# Patient Record
Sex: Male | Born: 1985 | Race: White | Hispanic: No | Marital: Single | Smoking: Current every day smoker
Health system: Southern US, Community
[De-identification: ages and names within clinical notes are randomized; demographics above are authoritative.]

## PROBLEM LIST (undated history)

## (undated) DIAGNOSIS — R454 Irritability and anger: Secondary | ICD-10-CM

## (undated) DIAGNOSIS — F319 Bipolar disorder, unspecified: Secondary | ICD-10-CM

## (undated) DIAGNOSIS — F1110 Opioid abuse, uncomplicated: Secondary | ICD-10-CM

## (undated) DIAGNOSIS — F419 Anxiety disorder, unspecified: Secondary | ICD-10-CM

## (undated) HISTORY — PX: APPENDECTOMY: SHX54

## (undated) HISTORY — PX: HERNIA REPAIR: SHX51

---

## 2006-10-18 ENCOUNTER — Emergency Department (HOSPITAL_COMMUNITY): Admission: EM | Admit: 2006-10-18 | Discharge: 2006-10-18 | Payer: Self-pay | Admitting: *Deleted

## 2009-12-06 ENCOUNTER — Ambulatory Visit (HOSPITAL_COMMUNITY)
Admission: RE | Admit: 2009-12-06 | Discharge: 2009-12-06 | Payer: Self-pay | Source: Home / Self Care | Admitting: Urology

## 2010-11-06 LAB — DIFFERENTIAL
Basophils Absolute: 0.1
Basophils Relative: 1
Eosinophils Absolute: 0
Lymphocytes Relative: 72 — ABNORMAL HIGH
Lymphs Abs: 8.1 — ABNORMAL HIGH
Monocytes Absolute: 1.6 — ABNORMAL HIGH
Neutro Abs: 1.5 — ABNORMAL LOW

## 2010-11-06 LAB — COMPREHENSIVE METABOLIC PANEL
ALT: 188 — ABNORMAL HIGH
AST: 164 — ABNORMAL HIGH
Albumin: 3.6
Alkaline Phosphatase: 147 — ABNORMAL HIGH
Chloride: 98
GFR calc Af Amer: >60
Potassium: 3.8
Sodium: 137
Total Bilirubin: 1.2
Total Protein: 6.9

## 2010-11-06 LAB — MONONUCLEOSIS SCREEN: Mono Screen: POSITIVE — AB

## 2010-11-06 LAB — CBC
HCT: 41.7
Platelets: 107 — ABNORMAL LOW
RDW: 13
WBC: 11.3 — ABNORMAL HIGH

## 2010-11-06 LAB — PATHOLOGIST SMEAR REVIEW

## 2010-11-06 LAB — URINALYSIS, ROUTINE W REFLEX MICROSCOPIC
Glucose, UA: NEGATIVE
Hgb urine dipstick: NEGATIVE
Leukocytes, UA: NEGATIVE
pH: 6.5

## 2011-07-15 ENCOUNTER — Emergency Department (HOSPITAL_COMMUNITY)
Admission: EM | Admit: 2011-07-15 | Discharge: 2011-07-15 | Disposition: A | Discharge status: Home / Self Care | Payer: Self-pay | Attending: Emergency Medicine | Admitting: Emergency Medicine

## 2011-07-15 ENCOUNTER — Encounter (HOSPITAL_COMMUNITY): Payer: Self-pay | Admitting: Emergency Medicine

## 2011-07-15 DIAGNOSIS — K029 Dental caries, unspecified: Secondary | ICD-10-CM | POA: Insufficient documentation

## 2011-07-15 DIAGNOSIS — F17200 Nicotine dependence, unspecified, uncomplicated: Secondary | ICD-10-CM | POA: Insufficient documentation

## 2011-07-15 DIAGNOSIS — Z79899 Other long term (current) drug therapy: Secondary | ICD-10-CM | POA: Insufficient documentation

## 2011-07-15 DIAGNOSIS — F319 Bipolar disorder, unspecified: Secondary | ICD-10-CM | POA: Insufficient documentation

## 2011-07-15 HISTORY — DX: Bipolar disorder, unspecified: F31.9

## 2011-07-15 HISTORY — DX: Irritability and anger: R45.4

## 2011-07-15 HISTORY — DX: Anxiety disorder, unspecified: F41.9

## 2011-07-15 MED ORDER — AMOXICILLIN 250 MG PO CAPS
1000.0000 mg | ORAL_CAPSULE | Freq: Once | ORAL | Status: AC
  Administered 2011-07-15: 1000 mg via ORAL
  Filled 2011-07-15: qty 4

## 2011-07-15 MED ORDER — OXYCODONE-ACETAMINOPHEN 5-325 MG PO TABS
1.0000 | ORAL_TABLET | Freq: Once | ORAL | Status: AC
  Administered 2011-07-15: 1 via ORAL
  Filled 2011-07-15: qty 1

## 2011-07-15 MED ORDER — OXYCODONE-ACETAMINOPHEN 5-325 MG PO TABS: 1.0000 | ORAL_TABLET | ORAL | Status: AC | PRN

## 2011-07-15 MED ORDER — AMOXICILLIN 500 MG PO CAPS: 1000.0000 mg | ORAL_CAPSULE | Freq: Two times a day (BID) | ORAL | Status: AC

## 2011-07-15 NOTE — Discharge Instructions (Signed)
Dental Caries  Tooth decay (dental caries, cavities) is the most common of all oral diseases. It occurs in all ages but is more common in children and young adults.  CAUSES  Bacteria in your mouth combine with foods (particularly sugars and starches) to produce plaque. Plaque is a substance that sticks to the hard surfaces of teeth. The bacteria in the plaque produce acids that attack the enamel of teeth. Repeated acid attacks dissolve the enamel and create holes in the teeth. Root surfaces of teeth may also get these holes.  Other contributing factors include:   Frequent snacking and drinking of cavity-producing foods and liquids.   Poor oral hygiene.   Dry mouth.   Substance abuse such as methamphetamine.   Broken or poor fitting dental restorations.   Eating disorders.   Gastroesophageal reflux disease (GERD).   Certain radiation treatments to the head and neck.  SYMPTOMS  At first, dental decay appears as white, chalky areas on the enamel. In this early stage, symptoms are seldom present. As the decay progresses, pits and holes may appear on the enamel surfaces. Progression of the decay will lead to softening of the hard layers of the tooth. At this point you may experience some pain or achy feeling after sweet, hot, or cold foods or drinks are consumed. If left untreated, the decay will reach the internal structures of the tooth and produce severe pain. Extensive dental treatment, such as root canal therapy, may be needed to save the tooth at this late stage of decay development.  DIAGNOSIS  Most cavities will be detected during regular check-ups. A thorough medical and dental history will be taken by the dentist. The dentist will use instruments to check the surfaces of your teeth for any breakdown or discoloration. Some dentists have special instruments, such as lasers, that detect tooth decay. Dental X-rays may also show some cavities that are not visible to the eye (such as between  the contact areas of the teeth). TREATMENT  Treatment involves removal of the tooth decay and replacement with a restorative material such as silver, gold, or composite (white) material. However, if the decay involves a large area of the tooth and there is little remaining healthy tooth structure, a cap (crown) will be fitted over the remaining structure. If the decay involves the center part of the tooth (pulp), root canal treatment will be needed before any type of dental restoration is placed. If the tooth is severely destroyed by the decay process, leaving the remaining tooth structures unrestorable, the tooth will need to be pulled (extracted). Some early tooth decay may be reversed by fluoride treatments and thorough brushing and flossing at home. PREVENTION   Eat healthy foods. Restrict the amount of sugary, starchy foods and liquids you consume. Avoid frequent snacking and drinking of unhealthy foods and liquids.   Sealants can help with prevention of cavities. Sealants are composite resins applied onto the biting surfaces of teeth at risk for decay. They smooth out the pits and grooves and prevent food from being trapped in them. This is done in early childhood before tooth decay has started.   Fluoride tablets may also be prescribed to children between 6 months and 10 years of age if your drinking water is not fluoridated. The fluoride absorbed by the tooth enamel makes teeth less susceptible to decay. Thorough daily cleaning with a toothbrush and dental floss is the best way to prevent cavities. Use of a fluoride toothpaste is highly recommended. Fluoride mouth rinses   may be used in specific cases.   Topical application of fluoride by your dentist is important in children.   Regular visits with a dentist for checkups and cleanings are also important.  SEEK IMMEDIATE DENTAL CARE IF:  You have a fever.   You develop redness and swelling of your face, jaw, or neck.   You develop swelling  around a tooth.   You are unable to open your mouth or cannot swallow.   You have severe pain uncontrolled by pain medicine.  Document Released: 10/04/2001 Document Revised: 01/01/2011 Document Reviewed: 06/19/2010 [facility] Patient Information 2012 [place], [place].  Amoxicillin capsules or tablets What is this medicine? AMOXICILLIN (a mox i SIL in) is a penicillin antibiotic. It is used to treat certain kinds of bacterial infections. It will not work for colds, flu, or other viral infections. This medicine may be used for other purposes; ask your health care provider or pharmacist if you have questions. What should I tell my health care provider before I take this medicine? They need to know if you have any of these conditions: -asthma -kidney disease -an unusual or allergic reaction to amoxicillin, other penicillins, cephalosporin antibiotics, other medicines, foods, dyes, or preservatives -pregnant or trying to get pregnant -breast-feeding How should I use this medicine? Take this medicine by mouth with a glass of water. Follow the directions on your prescription label. You may take this medicine with food or on an empty stomach. Take your medicine at regular intervals. Do not take your medicine more often than directed. Take all of your medicine as directed even if you think your are better. Do not skip doses or stop your medicine early. Talk to your pediatrician regarding the use of this medicine in children. While this drug may be prescribed for selected conditions, precautions do apply. Overdosage: If you think you have taken too much of this medicine contact a poison control center or emergency room at once. NOTE: This medicine is only for you. Do not share this medicine with others. What if I miss a dose? If you miss a dose, take it as soon as you can. If it is almost time for your next dose, take only that dose. Do not take double or extra doses. What may interact with this  medicine? -amiloride -birth control pills -chloramphenicol -macrolides -probenecid -sulfonamides -tetracyclines This list may not describe all possible interactions. Give your health care provider a list of all the medicines, herbs, non-prescription drugs, or dietary supplements you use. Also tell them if you smoke, drink alcohol, or use illegal drugs. Some items may interact with your medicine. What should I watch for while using this medicine? Tell your doctor or health care professional if your symptoms do not improve in 2 or 3 days. Take all of the doses of your medicine as directed. Do not skip doses or stop your medicine early. If you are diabetic, you may get a false positive result for sugar in your urine with certain brands of urine tests. Check with your doctor. Do not treat diarrhea with over-the-counter products. Contact your doctor if you have diarrhea that lasts more than 2 days or if the diarrhea is severe and watery. What side effects may I notice from receiving this medicine? Side effects that you should report to your doctor or health care professional as soon as possible: -allergic reactions like skin rash, itching or hives, swelling of the face, lips, or tongue -breathing problems -dark urine -redness, blistering, peeling or loosening of the  skin, including inside the mouth -seizures -severe or watery diarrhea -trouble passing urine or change in the amount of urine -unusual bleeding or bruising -unusually weak or tired -yellowing of the eyes or skin Side effects that usually do not require medical attention (report to your doctor or health care professional if they continue or are bothersome): -dizziness -headache -stomach upset -trouble sleeping This list may not describe all possible side effects. Call your doctor for medical advice about side effects. You may report side effects to FDA at 1-800-FDA-1088. Where should I keep my medicine? Keep out of the reach of  children. Store between 68 and 77 degrees F (20 and 25 degrees C). Keep bottle closed tightly. Throw away any unused medicine after the expiration date. NOTE: This sheet is a summary. It may not cover all possible information. If you have questions about this medicine, talk to your doctor, pharmacist, or health care provider.  2012, [employer] Standard. (04/05/2007 2:10:59 PM)  Acetaminophen; Oxycodone tablets What is this medicine? ACETAMINOPHEN; OXYCODONE (a set a MEE noe fen; ox i KOE done) is a pain reliever. It is used to treat mild to moderate pain. This medicine may be used for other purposes; ask your health care provider or pharmacist if you have questions. What should I tell my health care provider before I take this medicine? They need to know if you have any of these conditions: -brain tumor -Crohn's disease, inflammatory bowel disease, or ulcerative colitis -drink more than 3 alcohol containing drinks per day -drug abuse or addiction -head injury -heart or circulation problems -kidney disease or problems going to the bathroom -liver disease -lung disease, asthma, or breathing problems -an unusual or allergic reaction to acetaminophen, oxycodone, other opioid analgesics, other medicines, foods, dyes, or preservatives -pregnant or trying to get pregnant -breast-feeding How should I use this medicine? Take this medicine by mouth with a full glass of water. Follow the directions on the prescription label. Take your medicine at regular intervals. Do not take your medicine more often than directed. Talk to your pediatrician regarding the use of this medicine in children. Special care may be needed. Patients over 69 years old may have a stronger reaction and need a smaller dose. Overdosage: If you think you have taken too much of this medicine contact a poison control center or emergency room at once. NOTE: This medicine is only for you. Do not share this medicine with  others. What if I miss a dose? If you miss a dose, take it as soon as you can. If it is almost time for your next dose, take only that dose. Do not take double or extra doses. What may interact with this medicine? -alcohol or medicines that contain alcohol -antihistamines -barbiturates like amobarbital, butalbital, butabarbital, methohexital, pentobarbital, phenobarbital, thiopental, and secobarbital -benztropine -drugs for bladder problems like solifenacin, trospium, oxybutynin, tolterodine, hyoscyamine, and methscopolamine -drugs for breathing problems like ipratropium and tiotropium -drugs for certain stomach or intestine problems like propantheline, homatropine methylbromide, glycopyrrolate, atropine, belladonna, and dicyclomine -general anesthetics like etomidate, ketamine, nitrous oxide, propofol, desflurane, enflurane, halothane, isoflurane, and sevoflurane -medicines for depression, anxiety, or psychotic disturbances -medicines for pain like codeine, morphine, pentazocine, buprenorphine, butorphanol, nalbuphine, tramadol, and propoxyphene -medicines for sleep -muscle relaxants -naltrexone -phenothiazines like perphenazine, thioridazine, chlorpromazine, mesoridazine, fluphenazine, prochlorperazine, promazine, and trifluoperazine -scopolamine -trihexyphenidyl This list may not describe all possible interactions. Give your health care provider a list of all the medicines, herbs, non-prescription drugs, or dietary supplements you use. Also tell them  if you smoke, drink alcohol, or use illegal drugs. Some items may interact with your medicine. What should I watch for while using this medicine? Tell your doctor or health care professional if your pain does not go away, if it gets worse, or if you have new or a different type of pain. You may develop tolerance to the medicine. Tolerance means that you will need a higher dose of the medication for pain relief. Tolerance is normal and is  expected if you take this medicine for a long time. Do not suddenly stop taking your medicine because you may develop a severe reaction. Your body becomes used to the medicine. This does NOT mean you are addicted. Addiction is a behavior related to getting and using a drug for a nonmedical reason. If you have pain, you have a medical reason to take pain medicine. Your doctor will tell you how much medicine to take. If your doctor wants you to stop the medicine, the dose will be slowly lowered over time to avoid any side effects. You may get drowsy or dizzy. Do not drive, use machinery, or do anything that needs mental alertness until you know how this medicine affects you. Do not stand or sit up quickly, especially if you are an older patient. This reduces the risk of dizzy or fainting spells. Alcohol may interfere with the effect of this medicine. Avoid alcoholic drinks. The medicine will cause constipation. Try to have a bowel movement at least every 2 to 3 days. If you do not have a bowel movement for 3 days, call your doctor or health care professional. Do not take Tylenol (acetaminophen) or medicines that have acetaminophen with this medicine. Too much acetaminophen can be very dangerous. Many nonprescription medicines contain acetaminophen. Always read the labels carefully to avoid taking more acetaminophen. What side effects may I notice from receiving this medicine? Side effects that you should report to your doctor or health care professional as soon as possible: -allergic reactions like skin rash, itching or hives, swelling of the face, lips, or tongue -breathing difficulties, wheezing -confusion -light headedness or fainting spells -severe stomach pain -yellowing of the skin or the whites of the eyes Side effects that usually do not require medical attention (report to your doctor or health care professional if they continue or are  bothersome): -dizziness -drowsiness -nausea -vomiting This list may not describe all possible side effects. Call your doctor for medical advice about side effects. You may report side effects to FDA at 1-800-FDA-1088. Where should I keep my medicine? Keep out of the reach of children. This medicine can be abused. Keep your medicine in a safe place to protect it from theft. Do not share this medicine with anyone. Selling or giving away this medicine is dangerous and against the law. Store at room temperature between 20 and 25 degrees C (68 and 77 degrees F). Keep container tightly closed. Protect from light. Flush any unused medicines down the toilet. Do not use the medicine after the expiration date. NOTE: This sheet is a summary. It may not cover all possible information. If you have questions about this medicine, talk to your doctor, pharmacist, or health care provider.  2012, [employer]vier/Gold Standard. (12/12/2007 10:01:21 AM)

## 2011-07-15 NOTE — ED Notes (Signed)
Patient complaining of right lower dental pain x 2 days. States he is unable to get an appointment with his dentist.

## 2011-07-15 NOTE — ED Provider Notes (Signed)
History     CSN: [id number]  Arrival date & time 07/15/11  0054   First MD Initiated Contact with Patient 07/15/11 0350      Chief Complaint  Patient presents with  . Dental Pain    (Consider location/radiation/quality/duration/timing/severity/associated sxs/prior treatment) HPI 26 year old male comes in with a pain in his left lower molars for the last 2 days. Pain is worse with chewing, worse with exposure to heat and cold. He rates pain at 8/10. He is trying to get money together to see his dentist but was advised to come to the emergency department in the meantime. Nothing makes his pain any better. He states that he thinks it's because his wisdom teeth are coming in crooked and cracking his other teeth. 26 year old male who is resting comfortably and in no acute distress. Vital signs are significant for mild hypertension with blood pressure 154/70. Oxygen saturation is 100% which is normal. Head is normocephalic and atraumatic. PERRLA, EOMI. Examination your cavity shows had dental caries and teeth #18 and 19. These teeth are tender to percussion. I do not see any of his third molars. No other acute dental problem is identified. Neck is nontender and supple without adenopathy. Back is nontender. Lungs are clear without rales, wheezes, rhonchi. Heart has regular rate and rhythm without murmur. Abdomen is soft, flat, nontender without masses or hepatosplenomegaly and peristalsis is present. Extremities have no cyanosis or edema, full range of motion is present. Skin is warm and dry without rash. Neurologic: Mental status is normal, cranial nerves are intact, there are no motor or sensory deficits. Past Medical History  Diagnosis Date  . Bipolar 1 disorder   . Anxiety   . Outbursts of anger     Past Surgical History  Procedure Date  . Hernia repair     History reviewed. No pertinent family history.  History  Substance Use Topics  . Smoking status: Current Everyday Smoker -- 0.5  packs/day  . Smokeless tobacco: Not on file  . Alcohol Use: No      Review of Systems  Allergies  Review of patient's allergies indicates no known allergies.  Home Medications   Current Outpatient Rx  Name Route Sig Dispense Refill  . HYDROXYZINE HCL 10 MG PO TABS Oral Take 10 mg by mouth 3 (three) times daily as needed.    Marland Kitchen RISPERIDONE 1 MG PO TABS Oral Take 1 mg by mouth 2 (two) times daily.    . AMOXICILLIN 500 MG PO CAPS Oral Take 2 capsules (1,000 mg total) by mouth 2 (two) times daily. 40 capsule 0  . OXYCODONE-ACETAMINOPHEN 5-325 MG PO TABS Oral Take 1 tablet by mouth every 4 (four) hours as needed for pain. 20 tablet 0    BP 154/78  Pulse 71  Temp 97.4 F (36.3 C) (Oral)  Resp 14  Ht 6\' 3"  (1.905 m)  Wt 165 lb (74.844 kg)  BMI 20.62 kg/m2  SpO2 100%  Physical Exam  ED Course  Procedures (including critical care time)  Labs Reviewed - No data to display No results found.   1. Dental caries       MDM  Dental caries. He will need definitive treatment by a dentist. In the meantime, he is sent home with prescription for amoxicillin and Percocet and is told that he needs to see his dentist as soon as possible.        Dione Booze, MD 07/15/11 [phone]

## 2011-09-23 ENCOUNTER — Emergency Department (HOSPITAL_COMMUNITY)
Admission: EM | Admit: 2011-09-23 | Discharge: 2011-09-23 | Disposition: A | Discharge status: Home / Self Care | Payer: Self-pay | Attending: Emergency Medicine | Admitting: Emergency Medicine

## 2011-09-23 ENCOUNTER — Emergency Department (HOSPITAL_COMMUNITY): Payer: Self-pay

## 2011-09-23 ENCOUNTER — Encounter (HOSPITAL_COMMUNITY): Payer: Self-pay

## 2011-09-23 DIAGNOSIS — W2209XA Striking against other stationary object, initial encounter: Secondary | ICD-10-CM | POA: Insufficient documentation

## 2011-09-23 DIAGNOSIS — F17200 Nicotine dependence, unspecified, uncomplicated: Secondary | ICD-10-CM | POA: Insufficient documentation

## 2011-09-23 DIAGNOSIS — F319 Bipolar disorder, unspecified: Secondary | ICD-10-CM | POA: Insufficient documentation

## 2011-09-23 DIAGNOSIS — S92911A Unspecified fracture of right toe(s), initial encounter for closed fracture: Secondary | ICD-10-CM

## 2011-09-23 DIAGNOSIS — S92919A Unspecified fracture of unspecified toe(s), initial encounter for closed fracture: Secondary | ICD-10-CM | POA: Insufficient documentation

## 2011-09-23 MED ORDER — IBUPROFEN 600 MG PO TABS: 600.0000 mg | ORAL_TABLET | Freq: Four times a day (QID) | ORAL | Status: AC | PRN

## 2011-09-23 MED ORDER — HYDROCODONE-ACETAMINOPHEN 5-325 MG PO TABS: 1.0000 | ORAL_TABLET | ORAL | Status: AC | PRN

## 2011-09-23 NOTE — ED Notes (Signed)
Painful , contused rt 2nd toe. Injured 8 am.

## 2011-09-23 NOTE — ED Notes (Signed)
Running and slipped up on a welcome mat and bent my toe backwards per pt. Possibly broken per pt.

## 2011-09-24 NOTE — ED Provider Notes (Signed)
Medical screening examination/treatment/procedure(s) were performed by non-physician practitioner and as supervising physician I was immediately available for consultation/collaboration.  Donnetta Hutching, MD 09/24/11 2259

## 2011-09-24 NOTE — ED Provider Notes (Signed)
History     CSN: [id number]  Arrival date & time 09/23/11  2033   First MD Initiated Contact with Patient 09/23/11 2210      Chief Complaint  Patient presents with  . Toe Injury    (Consider location/radiation/quality/duration/timing/severity/associated sxs/prior treatment) HPI Comments: Andrew Huffman slipped on a welcome mat this morning,  Causing his right 2nd toe to hyperextend.  He proceed to work today,  But had persistent throbbing pain in the toe.  He was surprised when he got off work to find his entire was bruised and swollen.  He has taken ibuprofen which has helped somewhat with his pain.  There is no radiation of pain, flexing, bending and palpation makes his pain worse.    The history is provided by the patient.    Past Medical History  Diagnosis Date  . Bipolar 1 disorder   . Anxiety   . Outbursts of anger     Past Surgical History  Procedure Date  . Hernia repair     History reviewed. No pertinent family history.  History  Substance Use Topics  . Smoking status: Current Everyday Smoker -- 0.5 packs/day  . Smokeless tobacco: Not on file  . Alcohol Use: No      Review of Systems  Musculoskeletal: Positive for joint swelling and arthralgias.  Skin: Negative for wound.  Neurological: Negative for weakness and numbness.    Allergies  Review of patient's allergies indicates no known allergies.  Home Medications   Current Outpatient Rx  Name Route Sig Dispense Refill  . IBUPROFEN 200 MG PO TABS Oral Take 200 mg by mouth once.    Marland Kitchen HYDROCODONE-ACETAMINOPHEN 5-325 MG PO TABS Oral Take 1 tablet by mouth every 4 (four) hours as needed for pain. 15 tablet 0  . IBUPROFEN 600 MG PO TABS Oral Take 1 tablet (600 mg total) by mouth every 6 (six) hours as needed for pain. 30 tablet 0    BP 135/88  Pulse 82  Temp 98.4 F (36.9 C) (Oral)  Resp 24  Ht 6\' 3"  (1.905 m)  Wt 150 lb (68.04 kg)  BMI 18.75 kg/m2  SpO2 100%  Physical Exam  Constitutional: He  appears well-developed and well-nourished.  HENT:  Head: Atraumatic.  Neck: Normal range of motion.  Cardiovascular:       Pulses equal bilaterally  Musculoskeletal:       Right foot: He exhibits bony tenderness and swelling. He exhibits normal capillary refill and no deformity.       Feet:  Neurological: He is alert. He has normal strength. He displays normal reflexes. No sensory deficit.       Equal strength  Skin: Skin is warm and dry.  Psychiatric: He has a normal mood and affect.    ED Course  Procedures (including critical care time)  Labs Reviewed - No data to display Dg Toe 2nd Right  09/23/2011  *RADIOLOGY REPORT*  Clinical Data: Injury, pain.  RIGTH SECOND TOE  Comparison: None.  Findings: There is a volar plate avulsion fracture of the base of the middle phalanx of the second toe.  No other acute bony or joint abnormality is identified.  IMPRESSION: Volar plate avulsion fracture base of the middle phalanx of the second toe.   Original Report Authenticated By: Bernadene Bell. D'ALESSIO, M.D.      1. Toe fracture, right    Pts toes were buddy taped,  Post op shoe provided.    MDM  xrays reviewed and  discussed with pt.  Encouraged ice, elevation, ibuprofen.  Hydrocodone prescribed for prn pain.  Referral to Dr. Romeo Apple for recheck in 1 week,  Encouraged recheck sooner for any worsening problems.        Burgess Amor, PA 09/24/11 1420

## 2014-11-17 ENCOUNTER — Encounter (HOSPITAL_COMMUNITY): Payer: Self-pay | Admitting: *Deleted

## 2014-11-17 ENCOUNTER — Emergency Department (HOSPITAL_COMMUNITY)
Admission: EM | Admit: 2014-11-17 | Discharge: 2014-11-17 | Disposition: A | Discharge status: Home / Self Care | Payer: Self-pay | Attending: Emergency Medicine | Admitting: Emergency Medicine

## 2014-11-17 DIAGNOSIS — L02413 Cutaneous abscess of right upper limb: Secondary | ICD-10-CM | POA: Insufficient documentation

## 2014-11-17 DIAGNOSIS — Z79899 Other long term (current) drug therapy: Secondary | ICD-10-CM | POA: Insufficient documentation

## 2014-11-17 DIAGNOSIS — Z720 Tobacco use: Secondary | ICD-10-CM | POA: Insufficient documentation

## 2014-11-17 DIAGNOSIS — Z8659 Personal history of other mental and behavioral disorders: Secondary | ICD-10-CM | POA: Insufficient documentation

## 2014-11-17 DIAGNOSIS — L03113 Cellulitis of right upper limb: Secondary | ICD-10-CM | POA: Insufficient documentation

## 2014-11-17 MED ORDER — KETOROLAC TROMETHAMINE 60 MG/2ML IM SOLN
60.0000 mg | Freq: Once | INTRAMUSCULAR | Status: AC
Start: 2014-11-17 — End: 2014-11-17
  Administered 2014-11-17: 60 mg via INTRAMUSCULAR
  Filled 2014-11-17: qty 2

## 2014-11-17 MED ORDER — LIDOCAINE-EPINEPHRINE (PF) 2 %-1:200000 IJ SOLN
20.0000 mL | Freq: Once | INTRAMUSCULAR | Status: AC
  Administered 2014-11-17: 20 mL
  Filled 2014-11-17: qty 20

## 2014-11-17 MED ORDER — IBUPROFEN 800 MG PO TABS: 800.0000 mg | ORAL_TABLET | Freq: Three times a day (TID) | ORAL | Status: DC

## 2014-11-17 MED ORDER — SULFAMETHOXAZOLE-TRIMETHOPRIM 800-160 MG PO TABS: 1.0000 | ORAL_TABLET | Freq: Two times a day (BID) | ORAL | Status: AC

## 2014-11-17 MED ORDER — VANCOMYCIN HCL IN DEXTROSE 1-5 GM/200ML-% IV SOLN
1000.0000 mg | Freq: Once | INTRAVENOUS | Status: AC
  Administered 2014-11-17: 1000 mg via INTRAVENOUS
  Filled 2014-11-17: qty 200

## 2014-11-17 NOTE — ED Provider Notes (Signed)
CSN: [id number]     Arrival date & time 11/17/14  1453 History   First MD Initiated Contact with Patient 11/17/14 1503     Chief Complaint  Patient presents with  . Abscess     (Consider location/radiation/quality/duration/timing/severity/associated sxs/prior Treatment) HPI   Andrew Huffman is a 29 y.o. male, pt with history of IV drug use and mental illness, presents to the ED with complaint of swelling, redness, and pain to the flexor surface of his right arm for the past 3 days. Pt rates pain 10/10, radiates up his arm, and is burning in nature. Last injection of any drug was around 5am this morning and pt states he injected meth. Pt is under police custody, under arrest, and accompanied by [employer]. Pt denies N/V, fever/chills, dizziness, shortness of breath, chest pain, or any other complaints.   Past Medical History  Diagnosis Date  . Bipolar 1 disorder (HCC)   . Anxiety   . Outbursts of anger    Past Surgical History  Procedure Laterality Date  . Hernia repair    . Appendectomy     No family history on file. Social History  Substance Use Topics  . Smoking status: Current Every Day Smoker -- 0.50 packs/day  . Smokeless tobacco: None  . Alcohol Use: No    Review of Systems  Constitutional: Negative for fever, chills and diaphoresis.  Respiratory: Negative for shortness of breath.   Cardiovascular: Negative for chest pain.  Gastrointestinal: Negative for abdominal pain.  Skin:       Area of redness, pain, and swelling on right arm.  All other systems reviewed and are negative.     Allergies  Review of patient's allergies indicates no known allergies.  Home Medications   Prior to Admission medications   Medication Sig Start Date End Date Taking? Authorizing Provider  Cyanocobalamin (B-12 PO) Take 1 tablet by mouth daily.   Yes Historical Provider, MD  ibuprofen (ADVIL,MOTRIN) 200 MG tablet Take 200 mg by mouth once.    Historical Provider, MD   ibuprofen (ADVIL,MOTRIN) 800 MG tablet Take 1 tablet (800 mg total) by mouth 3 (three) times daily. 11/17/14   Shawn C Joy, PA-C  sulfamethoxazole-trimethoprim (BACTRIM DS,SEPTRA DS) 800-160 MG tablet Take 1 tablet by mouth 2 (two) times daily. 11/17/14 11/24/14  Shawn C Joy, PA-C   BP 102/66 mmHg  Pulse 65  Temp(Src) 97.6 F (36.4 C) (Oral)  Resp 18  Ht  (1.905 m)  Wt 160 lb (72.576 kg)  BMI 20.00 kg/m2  SpO2 98% Physical Exam  Constitutional: He appears well-developed and well-nourished. No distress.  HENT:  Head: Normocephalic and atraumatic.  Eyes: Conjunctivae are normal. Pupils are equal, round, and reactive to light.  Cardiovascular: Normal rate, regular rhythm and normal heart sounds.   Pulmonary/Chest: Effort normal. No respiratory distress.  Abdominal: Soft. Bowel sounds are normal.  Musculoskeletal: He exhibits no edema or tenderness.  Neurological: He is alert.  Skin: Skin is warm and dry. He is not diaphoretic.  Approximately 8 inch long area of redness on flexor surface of elbow and forearm. Swelling noted to anterior elbow with pain upon flexion. No discharge noted. Area of fluctuance and raised swelling to anterior elbow.  Sores in various stages of healing noted to arms, legs, chest and back.  Nursing note and vitals reviewed.   ED Course  .Marland KitchenIncision and Drainage Date/Time: 11/17/2014 5:26 PM Performed by: Anselm Pancoast Authorized by: Anselm Pancoast Consent: Verbal consent  obtained. Risks and benefits: risks, benefits and alternatives were discussed Consent given by: patient Patient understanding: patient states understanding of the procedure being performed Patient consent: the patient's understanding of the procedure matches consent given Procedure consent: procedure consent matches procedure scheduled Site marked: the operative site was marked Patient identity confirmed: verbally with patient and arm band Time out: Immediately prior to procedure a  "time out" was called to verify the correct patient, procedure, equipment, support staff and site/side marked as required. Type: abscess Body area: upper extremity Location details: right arm Anesthesia: local infiltration Local anesthetic: lidocaine 2% with epinephrine Anesthetic total: 4 ml Patient sedated: no Needle gauge: 18 Complexity: simple Drainage: purulent and  bloody Drainage amount: moderate Wound treatment: wound left open Packing material: none Patient tolerance: Patient tolerated the procedure well with no immediate complications   (including critical care time) Labs Review Labs Reviewed  WOUND CULTURE    Imaging Review No results found. I have personally reviewed and evaluated these images and lab results as part of my medical decision-making.   EKG Interpretation None      MDM   Final diagnoses:  Abscess of arm, right    Andrew Huffman presents with a swelling and fluctuation to the flexor surface of his right elbow.    Bedside ultrasound reveals cobblestoning that indicates cellulitis extending down pt forearm with an area of possible abscess in flexor surface of right arm. Saved ultrasound images include: normal tissue, cellulitic tissue, and area of fluctuance and swelling.  Drainage performed with approximately 1mL of purulent and bloody fluid obtained. Pt to receive IV Vancomycin and discharged. Erythema borders drawn and labeled.     Anselm PancoastShawn C Joy, PA-C 11/18/14 2208  Eber HongBrian Miller, MD 11/20/14 [phone]

## 2014-11-17 NOTE — ED Notes (Signed)
Red swollen area right lower arm

## 2014-11-17 NOTE — Discharge Instructions (Signed)
You have been seen today for an abscess, which was drained. You are being discharged with antibiotics and ibuprofen. You should take the antibiotics in their entirety. Followup with PCP as needed. Return to ED should symptoms worsen or redness extends outside of the lines drawn on your arm.   Abscess An abscess is an infected area that contains a collection of pus and debris.It can occur in almost any part of the body. An abscess is also known as a furuncle or boil. CAUSES  An abscess occurs when tissue gets infected. This can occur from blockage of oil or sweat glands, infection of hair follicles, or a minor injury to the skin. As the body tries to fight the infection, pus collects in the area and creates pressure under the skin. This pressure causes pain. People with weakened immune systems have difficulty fighting infections and get certain abscesses more often.  SYMPTOMS Usually an abscess develops on the skin and becomes a painful mass that is red, warm, and tender. If the abscess forms under the skin, you may feel a moveable soft area under the skin. Some abscesses break open (rupture) on their own, but most will continue to get worse without care. The infection can spread deeper into the body and eventually into the bloodstream, causing you to feel ill.  DIAGNOSIS  Your caregiver will take your medical history and perform a physical exam. A sample of fluid may also be taken from the abscess to determine what is causing your infection. TREATMENT  Your caregiver may prescribe antibiotic medicines to fight the infection. However, taking antibiotics alone usually does not cure an abscess. Your caregiver may need to make a small cut (incision) in the abscess to drain the pus. In some cases, gauze is packed into the abscess to reduce pain and to continue draining the area. HOME CARE INSTRUCTIONS   Only take over-the-counter or prescription medicines for pain, discomfort, or fever as directed by your  caregiver.  If you were prescribed antibiotics, take them as directed. Finish them even if you start to feel better.  If gauze is used, follow your caregiver's directions for changing the gauze.  To avoid spreading the infection:  Keep your draining abscess covered with a bandage.  Wash your hands well.  Do not share personal care items, towels, or whirlpools with others.  Avoid skin contact with others.  Keep your skin and clothes clean around the abscess.  Keep all follow-up appointments as directed by your caregiver. SEEK MEDICAL CARE IF:   You have increased pain, swelling, redness, fluid drainage, or bleeding.  You have muscle aches, chills, or a general ill feeling.  You have a fever. MAKE SURE YOU:   Understand these instructions.  Will watch your condition.  Will get help right away if you are not doing well or get worse.   This information is not intended to replace advice given to you by your health care provider. Make sure you discuss any questions you have with your health care provider.   Document Released: 10/22/2004 Document Revised: 07/14/2011 Document Reviewed: 03/27/2011 Elsevier Interactive Patient Education 2016 Else[employer] Inc[employer]  Percutaneous Abscess Drain, Care After Refer to this sheet in the next few weeks. These instructions provide you with information on caring for yourself after your procedure. Your health care provider may also give you more specific instructions. Your treatment has been planned according to current medical practices, but problems sometimes occur. Call your health care provider if you have any problems or  questions after your procedure. WHAT TO EXPECT AFTER THE PROCEDURE After your procedure, it is typical to have the following:   A small amount of discomfort in the area where the drainage tube was placed.  A small amount of bruising around the area where the drainage tube was placed.  Sleepiness and fatigue for the rest of  the day from the medicines used. HOME CARE INSTRUCTIONS  Rest at home for 1-2 days following your procedure or as directed by your health care provider.  If you go home right after the procedure, plan to have someone with you for 24 hours.  Do not take a bathor shower for 24 hours after your procedure.  Take medicines only as directed by your health care provider. Ask your health care provider when you can resume taking any normal medicines.  Change bandages (dressings) as directed.   You may be told to record the amount of drainage from the bag every time you empty it. Follow your health care provider's directions for emptying the bag. Write down the amount of drainage, the date, and the time you emptied it.  Call your health care provider when the drain is putting out less than 10 mL of drainage per day for 2-3 days in a row or as directed by your health care provider.  Follow your health care provider's instructions for cleaning the drainage tube. You may need to clean the tube every day so that it does not clog. SEEK MEDICAL CARE IF:  You have increased bleeding (more than a small spot) from the site where the drainage tube was placed.  You have redness, swelling, or increasing pain around the site where the drainage tube was placed.  You notice a discharge or bad smell coming from the site where the drainage tube was placed.  You have a fever or chills.  You have pain that is not helped by medicine.  SEEK IMMEDIATE MEDICAL CARE IF:  There is leakage around the drainage tube.  The drainage tube pulls out.  You suddenly stop having drainage from the tube.  You suddenly have blood in the drainage fluid.  You become dizzy or faint.  You develop a rash.   You have nausea or vomiting.  You have difficulty breathing, feel short of breath, or feel faint.   You develop chest pain.  You have problems with your speech or vision.  You have trouble balancing or moving  your arms or legs.   This information is not intended to replace advice given to you by your health care provider. Make sure you discuss any questions you have with your health care provider.   Document Released: 05/29/2013 Document Revised: 10/31/2013 Document Reviewed: 05/29/2013 Elsevier Interactive Patient Education [employer]

## 2014-11-18 NOTE — ED Provider Notes (Signed)
The patient is a 29 year old male, he has a history of IV drug use and was arrested by police today on charges related to other reasons, he presents because of redness and swelling to his right arm, he denies fevers, he has pain with movement of the arm, he states that he uses needle injection in the right antecubital fossa recently. On exam he does have erythema with a central area of induration at the antecubital fossa, bedside ultrasound reveals extensive cobblestoning over the erythematous area, this is not circumferential around the arm, there is some decreased range of motion secondary to pain, there is no subcutaneous emphysema. Needle drainage of a very small central abscess was performed with approximately 1 mL of purulent material, please see physician assistant note regarding this procedure. I was present and assisted during the entire procedure. IV antibiotics were given, the patient was started on oral antibiotics and instructed to return for a follow-up exam within 24-48 hours or sooner if not improving.  Filed Vitals:   11/17/14 1842  BP: 102/66  Pulse: 65  Temp:   Resp: 18    Medical screening examination/treatment/procedure(s) were conducted as a shared visit with non-physician practitioner(s) and myself.  I personally evaluated the patient during the encounter.  Clinical Impression:   Final diagnoses:  Abscess of arm, right         Eber HongBrian Rosendo Couser, MD 11/20/14 [phone]

## 2014-11-21 LAB — WOUND CULTURE
GRAM STAIN: NONE SEEN
SPECIAL REQUESTS: NORMAL

## 2014-11-22 ENCOUNTER — Telehealth (HOSPITAL_COMMUNITY): Payer: Self-pay

## 2014-11-22 NOTE — Telephone Encounter (Signed)
Post ED Visit - Positive Culture Follow-up  Culture report reviewed by antimicrobial stewardship pharmacist:  []  Celedonio MiyamotoJeremy Frens, Pharm.D., BCPS []  Georgina PillionElizabeth Martin, [address] BCPS []  [place]h Pham, [place]D., BCPS, AAHIVP []  Estella HuskMichelle Turner, Pharm.D., BCPS, AAHIVP []  [place]y Reyes, [address]Ph[address] [x]  Mendel Corningick G. Pharm DCassie TompkinsvilleStewart, [place]Pharm.D.  Positive wound culture Treated with Bactrim DS, organism sensitive to the same and no further patient follow-up is required at this time.  Ashley JacobsFesterman, Sway Guttierrez C 11/22/2014, 10:52 AM

## 2015-06-11 ENCOUNTER — Observation Stay (HOSPITAL_COMMUNITY)

## 2015-06-11 ENCOUNTER — Inpatient Hospital Stay (HOSPITAL_COMMUNITY)
Admission: EM | Admit: 2015-06-11 | Discharge: 2015-06-14 | DRG: 917 | Attending: Internal Medicine | Admitting: Internal Medicine

## 2015-06-11 ENCOUNTER — Emergency Department (HOSPITAL_COMMUNITY)

## 2015-06-11 ENCOUNTER — Encounter (HOSPITAL_COMMUNITY): Payer: Self-pay | Admitting: Emergency Medicine

## 2015-06-11 DIAGNOSIS — F1410 Cocaine abuse, uncomplicated: Secondary | ICD-10-CM | POA: Diagnosis present

## 2015-06-11 DIAGNOSIS — T50901A Poisoning by unspecified drugs, medicaments and biological substances, accidental (unintentional), initial encounter: Secondary | ICD-10-CM

## 2015-06-11 DIAGNOSIS — X58XXXA Exposure to other specified factors, initial encounter: Secondary | ICD-10-CM | POA: Diagnosis present

## 2015-06-11 DIAGNOSIS — F329 Major depressive disorder, single episode, unspecified: Secondary | ICD-10-CM | POA: Diagnosis present

## 2015-06-11 DIAGNOSIS — T401X1A Poisoning by heroin, accidental (unintentional), initial encounter: Principal | ICD-10-CM | POA: Diagnosis present

## 2015-06-11 DIAGNOSIS — F1210 Cannabis abuse, uncomplicated: Secondary | ICD-10-CM | POA: Diagnosis present

## 2015-06-11 DIAGNOSIS — R739 Hyperglycemia, unspecified: Secondary | ICD-10-CM | POA: Diagnosis present

## 2015-06-11 DIAGNOSIS — R4182 Altered mental status, unspecified: Secondary | ICD-10-CM | POA: Diagnosis not present

## 2015-06-11 DIAGNOSIS — D72829 Elevated white blood cell count, unspecified: Secondary | ICD-10-CM | POA: Diagnosis present

## 2015-06-11 DIAGNOSIS — R000 Tachycardia, unspecified: Secondary | ICD-10-CM | POA: Diagnosis present

## 2015-06-11 DIAGNOSIS — F1510 Other stimulant abuse, uncomplicated: Secondary | ICD-10-CM | POA: Diagnosis present

## 2015-06-11 DIAGNOSIS — F411 Generalized anxiety disorder: Secondary | ICD-10-CM | POA: Diagnosis present

## 2015-06-11 DIAGNOSIS — F319 Bipolar disorder, unspecified: Secondary | ICD-10-CM | POA: Diagnosis present

## 2015-06-11 DIAGNOSIS — R112 Nausea with vomiting, unspecified: Secondary | ICD-10-CM | POA: Diagnosis present

## 2015-06-11 DIAGNOSIS — T185XXA Foreign body in anus and rectum, initial encounter: Secondary | ICD-10-CM | POA: Diagnosis present

## 2015-06-11 DIAGNOSIS — F1110 Opioid abuse, uncomplicated: Secondary | ICD-10-CM | POA: Diagnosis present

## 2015-06-11 DIAGNOSIS — I468 Cardiac arrest due to other underlying condition: Secondary | ICD-10-CM | POA: Diagnosis present

## 2015-06-11 DIAGNOSIS — K648 Other hemorrhoids: Secondary | ICD-10-CM | POA: Diagnosis present

## 2015-06-11 DIAGNOSIS — F1910 Other psychoactive substance abuse, uncomplicated: Secondary | ICD-10-CM | POA: Diagnosis present

## 2015-06-11 DIAGNOSIS — F17200 Nicotine dependence, unspecified, uncomplicated: Secondary | ICD-10-CM | POA: Diagnosis present

## 2015-06-11 DIAGNOSIS — F32A Depression, unspecified: Secondary | ICD-10-CM | POA: Diagnosis present

## 2015-06-11 DIAGNOSIS — I471 Supraventricular tachycardia: Secondary | ICD-10-CM | POA: Diagnosis present

## 2015-06-11 DIAGNOSIS — G4700 Insomnia, unspecified: Secondary | ICD-10-CM | POA: Diagnosis present

## 2015-06-11 HISTORY — DX: Opioid abuse, uncomplicated: F11.10

## 2015-06-11 LAB — COMPREHENSIVE METABOLIC PANEL
ALBUMIN: 4.1 g/dL (ref 3.5–5.0)
ALK PHOS: 72 U/L (ref 38–126)
ALT: 44 U/L (ref 17–63)
AST: 42 U/L — AB (ref 15–41)
Anion gap: 9 (ref 5–15)
BILIRUBIN TOTAL: 0.4 mg/dL (ref 0.3–1.2)
BUN: 13 mg/dL (ref 6–20)
CALCIUM: 9 mg/dL (ref 8.9–10.3)
CO2: 30 mmol/L (ref 22–32)
Chloride: 96 mmol/L — ABNORMAL LOW (ref 101–111)
Creatinine, Ser: 0.93 mg/dL (ref 0.61–1.24)
GFR calc Af Amer: >60 mL/min (ref >60)
GLUCOSE: 187 mg/dL — AB (ref 65–99)
POTASSIUM: 4 mmol/L (ref 3.5–5.1)
Sodium: 135 mmol/L (ref 135–145)
TOTAL PROTEIN: 7.4 g/dL (ref 6.5–8.1)

## 2015-06-11 LAB — CBC WITH DIFFERENTIAL/PLATELET
BASOS PCT: 0 %
Basophils Absolute: 0 10*3/uL (ref 0.0–0.1)
Eosinophils Absolute: 0.1 10*3/uL (ref 0.0–0.7)
Eosinophils Relative: 0 %
HEMATOCRIT: 44.3 % (ref 39.0–52.0)
HEMOGLOBIN: 14.2 g/dL (ref 13.0–17.0)
LYMPHS PCT: 5 %
Lymphs Abs: 0.7 10*3/uL (ref 0.7–4.0)
MCH: 28.4 pg (ref 26.0–34.0)
MCHC: 32.1 g/dL (ref 30.0–36.0)
MCV: 88.6 fL (ref 78.0–100.0)
MONO ABS: 0.7 10*3/uL (ref 0.1–1.0)
MONOS PCT: 5 %
NEUTROS ABS: 12.3 10*3/uL — AB (ref 1.7–7.7)
NEUTROS PCT: 90 %
Platelets: 150 10*3/uL (ref 150–400)
RBC: 5 MIL/uL (ref 4.22–5.81)
RDW: 12.6 % (ref 11.5–15.5)
WBC: 13.7 10*3/uL — ABNORMAL HIGH (ref 4.0–10.5)

## 2015-06-11 LAB — RAPID URINE DRUG SCREEN, HOSP PERFORMED
AMPHETAMINES: POSITIVE — AB
BARBITURATES: NOT DETECTED
Benzodiazepines: NOT DETECTED
Cocaine: POSITIVE — AB
OPIATES: POSITIVE — AB
TETRAHYDROCANNABINOL: POSITIVE — AB

## 2015-06-11 LAB — GLUCOSE, CAPILLARY: GLUCOSE-CAPILLARY: 85 mg/dL (ref 65–99)

## 2015-06-11 MED ORDER — SODIUM CHLORIDE 0.9% FLUSH
3.0000 mL | Freq: Two times a day (BID) | INTRAVENOUS | Status: DC
  Administered 2015-06-11 – 2015-06-14 (×5): 3 mL via INTRAVENOUS

## 2015-06-11 MED ORDER — ONDANSETRON HCL 4 MG/2ML IJ SOLN: 4.0000 mg | Freq: Four times a day (QID) | INTRAMUSCULAR | Status: DC | PRN

## 2015-06-11 MED ORDER — MAGNESIUM CITRATE PO SOLN: 1.0000 | Freq: Once | ORAL | Status: DC | PRN

## 2015-06-11 MED ORDER — ONDANSETRON HCL 4 MG/2ML IJ SOLN
4.0000 mg | Freq: Once | INTRAMUSCULAR | Status: AC
  Administered 2015-06-11: 4 mg via INTRAVENOUS
  Filled 2015-06-11: qty 2

## 2015-06-11 MED ORDER — ONDANSETRON HCL 4 MG PO TABS: 4.0000 mg | ORAL_TABLET | Freq: Four times a day (QID) | ORAL | Status: DC | PRN

## 2015-06-11 MED ORDER — ACETAMINOPHEN 650 MG RE SUPP: 650.0000 mg | Freq: Four times a day (QID) | RECTAL | Status: DC | PRN

## 2015-06-11 MED ORDER — SODIUM CHLORIDE 0.9 % IV SOLN: 250.0000 mL | INTRAVENOUS | Status: DC | PRN

## 2015-06-11 MED ORDER — BISACODYL 5 MG PO TBEC
5.0000 mg | DELAYED_RELEASE_TABLET | Freq: Every day | ORAL | Status: DC | PRN
  Administered 2015-06-12: 5 mg via ORAL
  Filled 2015-06-11 (×2): qty 1

## 2015-06-11 MED ORDER — POLYETHYLENE GLYCOL 3350 17 G PO PACK: 17.0000 g | PACK | Freq: Every day | ORAL | Status: DC | PRN

## 2015-06-11 MED ORDER — INSULIN ASPART 100 UNIT/ML ~~LOC~~ SOLN: 0.0000 [IU] | Freq: Three times a day (TID) | SUBCUTANEOUS | Status: DC

## 2015-06-11 MED ORDER — SODIUM CHLORIDE 0.9% FLUSH: 3.0000 mL | INTRAVENOUS | Status: DC | PRN

## 2015-06-11 MED ORDER — NALOXONE HCL 2 MG/2ML IJ SOSY
2.0000 mg | PREFILLED_SYRINGE | Freq: Once | INTRAMUSCULAR | Status: AC
  Administered 2015-06-11: 2 mg via INTRAVENOUS
  Filled 2015-06-11: qty 2

## 2015-06-11 MED ORDER — ACETAMINOPHEN 325 MG PO TABS
650.0000 mg | ORAL_TABLET | Freq: Four times a day (QID) | ORAL | Status: DC | PRN
  Administered 2015-06-12: 650 mg via ORAL
  Filled 2015-06-11: qty 2

## 2015-06-11 MED ORDER — CITALOPRAM HYDROBROMIDE 20 MG PO TABS
20.0000 mg | ORAL_TABLET | Freq: Every day | ORAL | Status: DC
  Administered 2015-06-11 – 2015-06-14 (×4): 20 mg via ORAL
  Filled 2015-06-11 (×4): qty 1

## 2015-06-11 MED ORDER — TRAZODONE HCL 50 MG PO TABS
150.0000 mg | ORAL_TABLET | Freq: Every day | ORAL | Status: DC
  Administered 2015-06-12: 150 mg via ORAL
  Filled 2015-06-11 (×2): qty 3

## 2015-06-11 MED ORDER — SODIUM CHLORIDE 0.9% FLUSH
3.0000 mL | Freq: Two times a day (BID) | INTRAVENOUS | Status: DC
  Administered 2015-06-11 – 2015-06-14 (×3): 3 mL via INTRAVENOUS

## 2015-06-11 MED ORDER — ACTIDOSE WITH SORBITOL 50 GM/240ML PO LIQD
50.0000 g | Freq: Once | ORAL | Status: AC
Start: 2015-06-11 — End: 2015-06-11
  Administered 2015-06-11: 50 g via ORAL
  Filled 2015-06-11: qty 240

## 2015-06-11 MED ORDER — ENOXAPARIN SODIUM 40 MG/0.4ML ~~LOC~~ SOLN
40.0000 mg | SUBCUTANEOUS | Status: DC
  Administered 2015-06-11 – 2015-06-13 (×3): 40 mg via SUBCUTANEOUS
  Filled 2015-06-11 (×3): qty 0.4

## 2015-06-11 NOTE — ED Notes (Signed)
Per EMS, pt was found unresponsive in his jail cell at 1515. States 5 rounds of CPR performed by deputies. Upon EMS arrival, pt cyanotic with agonal respirations. Pt given 1 mg of Narcan and became AOx4. Pt admits to doing heroin yesterday. Pt also admits to having 1.5g of heroin in bag placed in his rectum and bag possibly exploded.

## 2015-06-11 NOTE — ED Notes (Signed)
EDP at bedside  

## 2015-06-11 NOTE — H&P (Signed)
History and Physical    Andrew Huffman WUJ:[id number] DOB: May 04, 1985 DOA: 06/11/2015  PCP: Rudi Heap, MD   [facility]: [facility]  Chief Complaint: Post cardiopulmonary arrest, suspected heroin overdose   HPI: Andrew Huffman is a 30 y.o. male with medical history significant for bipolar disorder, anxiety, and polysubstance abuse who presents with corrections officers from jail following cardiopulmonary arrest suspected secondary to heroin overdose. Patient was reportedly arrested yesterday and states that just prior to this, he placed a bag of 1.5 g heroin in his anus. He also reports using intravenous heroin yesterday. He was found unresponsive in his jail cell approximately 3:15 PM on the date of his admission and underwent 5 rounds of CPR with corrections officer's until EMS arrived and found the patient to be cyanotic and with agonal respirations. A milligram of Narcan was administered by EMS and the patient's condition improved dramatically. The patient's reticence limits the history, but he denies any recent illness and did not have chest pain or palpitations prior to CPR being performed. He denies removing the bag of heroin from his rectum, denies having a bowel movement since it was placed, and suspects that it is still there.  ED Course: Upon arrival to the ED, patient is found to be afebrile, saturating adequately on 2 L/m, tachycardic in the 110s, and with stable blood pressure. Chest x-ray is negative for acute cardiopulmonary disease and KUB is essentially normal with no foreign body identified. Rectal exam is negative for foreign body. CMP is notable for a serum glucose of 187 and CBC features a leukocytosis to 13,700. EKG features a sinus tachycardia with rate 101 and RSR'in V1. UDS is positive for amphetamines, cocaine, THC, and opiates. Patient required another dose of Narcan soon after his arrival in the emergency department. He became nauseous with  nonbloody and nonbilious vomiting and Zofran was administered. Poison control was contacted and recommended observing the patient for at least 6 hours beyond the last Narcan dose. Charcoal was also advised by poison control in administered in the ED. Tachycardia resolved, patient remained hemodynamically stable, and will be admitted for ongoing evaluation and management of suspected heroin overdose with possible retained bag of heroin in the rectum.  Review of Systems:  All other systems reviewed and apart from HPI, are negative.  Past Medical History  Diagnosis Date  . Bipolar 1 disorder (HCC)   . Anxiety   . Outbursts of anger   . Drug abuse, opioid type     Past Surgical History  Procedure Laterality Date  . Hernia repair    . Appendectomy       reports that he has been smoking.  He does not have any smokeless tobacco history on file. He reports that he uses illicit drugs (IV). He reports that he does not drink alcohol.  No Known Allergies  History reviewed. No pertinent family history.   Prior to Admission medications   Medication Sig Start Date End Date Taking? Authorizing Provider  citalopram (CELEXA) 20 MG tablet Take 20 mg by mouth daily.   Yes Historical Provider, MD  traZODone (DESYREL) 150 MG tablet Take 150 mg by mouth at bedtime.   Yes Historical Provider, MD    Physical Exam: Filed Vitals:   06/11/15 1730 06/11/15 1800 06/11/15 1830 06/11/15 1944  BP: 118/61 111/63 117/57 135/46  Pulse: 91 91 80 76  Temp:    98.5 F (36.9 C)  TempSrc:    Oral  Resp: 14 11  13 16  Height:    6\' 3"  (1.905 m)  Weight:    66.724 kg (147 lb 1.6 oz)  SpO2: 97% 99% 100% 93%      Constitutional: NAD, calm, comfortable. Cachectic Eyes: PERTLA, lids and conjunctivae normal ENMT: Mucous membranes are moist. Posterior pharynx clear of any exudate or lesions.   Neck: normal, supple, no masses, no thyromegaly Respiratory: clear to auscultation bilaterally, no wheezing, no crackles.  Normal respiratory effort.    Cardiovascular: Rate ~120 and regular, no significant murmurs / rubs / gallops. No extremity edema. 2+ pedal pulses.   Abdomen: No distension, no tenderness, no masses palpated. Bowel sounds normal.  Musculoskeletal: no clubbing / cyanosis. No joint deformity upper and lower extremities. Normal muscle tone.  Skin: Punctate erythematous papules and superficial excoriations about the extremities, chest, and face. Warm, dry, well-perfused. Neurologic: CN 2-12 grossly intact. Sensation intact, DTR normal. Strength 5/5 in all 4 limbs.  Psychiatric: Normal judgment and insight. Alert and oriented x 3. Normal mood and affect.     Labs on Admission: I have personally reviewed following labs and imaging studies  CBC:  Recent Labs Lab 06/11/15 1638  WBC 13.7*  NEUTROABS 12.3*  HGB 14.2  HCT 44.3  MCV 88.6  PLT 150   Basic Metabolic Panel:  Recent Labs Lab 06/11/15 1638  NA 135  K 4.0  CL 96*  CO2 30  GLUCOSE 187*  BUN 13  CREATININE 0.93  CALCIUM 9.0   GFR: Estimated Creatinine Clearance: 108.6 mL/min (by C-G formula based on Cr of 0.93). Liver Function Tests:  Recent Labs Lab 06/11/15 1638  AST 42*  ALT 44  ALKPHOS 72  BILITOT 0.4  PROT 7.4  ALBUMIN 4.1   No results for input(s): LIPASE, AMYLASE in the last 168 hours. No results for input(s): AMMONIA in the last 168 hours. Coagulation Profile: No results for input(s): INR, PROTIME in the last 168 hours. Cardiac Enzymes: No results for input(s): CKTOTAL, CKMB, CKMBINDEX, TROPONINI in the last 168 hours. BNP (last 3 results) No results for input(s): PROBNP in the last 8760 hours. HbA1C: No results for input(s): HGBA1C in the last 72 hours. CBG: No results for input(s): GLUCAP in the last 168 hours. Lipid Profile: No results for input(s): CHOL, HDL, LDLCALC, TRIG, CHOLHDL, LDLDIRECT in the last 72 hours. Thyroid Function Tests: No results for input(s): TSH, T4TOTAL, FREET4, T3FREE,  THYROIDAB in the last 72 hours. Anemia Panel: No results for input(s): VITAMINB12, FOLATE, FERRITIN, TIBC, IRON, RETICCTPCT in the last 72 hours. Urine analysis:    Component Value Date/Time   COLORURINE AMBER BIOCHEMICALS MAY BE AFFECTED BY COLOR* 10/18/2006 1359   APPEARANCEUR CLEAR 10/18/2006 1359   LABSPEC 1.017 10/18/2006 1359   PHURINE 6.5 10/18/2006 1359   GLUCOSEU NEGATIVE 10/18/2006 1359   HGBUR NEGATIVE 10/18/2006 1359   BILIRUBINUR SMALL* 10/18/2006 1359   KETONESUR TRACE* 10/18/2006 1359   PROTEINUR 30* 10/18/2006 1359   UROBILINOGEN 4.0* 10/18/2006 1359   NITRITE NEGATIVE 10/18/2006 1359   LEUKOCYTESUR NEGATIVE 10/18/2006 1359   Sepsis Labs: @LABRCNTIP (procalcitonin:4,lacticidven:4) )No results found for this or any previous visit (from the past 240 hour(s)).   Radiological Exams on Admission: Dg Chest Portable 1 View  06/11/2015  CLINICAL DATA:  Shortness of breath. EXAM: PORTABLE CHEST 1 VIEW COMPARISON:  March 09, 2015. FINDINGS: The heart size and mediastinal contours are within normal limits. Both lungs are clear. No pneumothorax or pleural effusion is noted. The visualized skeletal structures are unremarkable. IMPRESSION: No  acute cardiopulmonary abnormality seen. Electronically Signed   By: Lupita RaiderJames  Green Jr, M.D.   On: 06/11/2015 16:54   Dg Abd Portable 1v  06/11/2015  CLINICAL DATA:  Abdominal pain. EXAM: PORTABLE ABDOMEN - 1 VIEW COMPARISON:  None. FINDINGS: The bowel gas pattern is normal. No radio-opaque calculi or other significant radiographic abnormality are seen. IMPRESSION: No evidence of bowel obstruction or ileus. Electronically Signed   By: Lupita RaiderJames  Green Jr, M.D.   On: 06/11/2015 17:01    EKG: Independently reviewed. Sinus tachycardia (rate 101), RSR' in V1   Assessment/Plan  1. Opiate overdose with cardiopulmonary arrest  - Resuscitated on the scene; good response to Narcan in the field, and again in ED  - Presumed secondary to heroin overdose    - Poison control advises monitoring for at least 6 hours after last Narcan dose  - Watching on telemetry    2. ?Rectal foreign body  - Pt reports placing 1.5 gram bag of heroin in rectum yesterday; reports that it is still there  - No foreign body seen on KUB; not felt on DRE  - Discussed with radiology; will check a CT pelvis    3. Drug abuse  - UDS positive for THC, amphetamines, cocaine, opiates - SW consulted for possible resources    4. Leukocytosis  - WBC is 13,700 on admission, primarily neutrophils  - No source of infection evident at this time  - Suspected this is reflective of stress demargination in post-code setting    5. Depression, insomnia   - Bipolar 1 disorder documented, not on any antipsychotics  - Pt denies SI, HI, or hallucinations; denies intentional overdose  - Continue current-dose Celexa, trazodone    DVT prophylaxis: sq Lovenox  Code Status: Full  Family Communication: Discussed with patient  Disposition Plan: Observe on telemetry   Consults called: None  Admission status: Observation    Briscoe Deutscherimothy S Opyd, MD [facility] Pager [phone]  If 7PM-7AM, please contact night-coverage [email or web address] Password [email or web address] [email or web address]  06/11/2015, 8:17 PM

## 2015-06-11 NOTE — ED Notes (Signed)
Attempted report x1. 

## 2015-06-11 NOTE — ED Notes (Signed)
Phlebotomy at bedside.

## 2015-06-11 NOTE — ED Provider Notes (Signed)
CSN: [id number]     Arrival date & time 06/11/15  1559 History   First MD Initiated Contact with Patient 06/11/15 1619     Chief Complaint  Patient presents with  . Drug Overdose     (Consider location/radiation/quality/duration/timing/severity/associated sxs/prior Treatment) Patient is a 30 y.o. male presenting with Overdose. The history is provided by the patient and the police (Patient was arrested yesterday around 1 PM just prior to his arrest he put 1.5 g of heroin in a plastic bag up his bottom. He was found today unresponsive and blue. Patient was resuscitated with Narcan. When he arrived here he was lethargic).  Drug Overdose This is a new problem. The current episode started 1 to 2 hours ago. The problem occurs constantly. The problem has been rapidly improving. Pertinent negatives include no chest pain, no abdominal pain and no headaches. Nothing aggravates the symptoms. Nothing relieves the symptoms.    Past Medical History  Diagnosis Date  . Bipolar 1 disorder (HCC)   . Anxiety   . Outbursts of anger   . Drug abuse, opioid type    Past Surgical History  Procedure Laterality Date  . Hernia repair    . Appendectomy     No family history on file. Social History  Substance Use Topics  . Smoking status: Current Every Day Smoker -- 0.50 packs/day  . Smokeless tobacco: None  . Alcohol Use: No    Review of Systems  Constitutional: Negative for appetite change and fatigue.  HENT: Negative for congestion, ear discharge and sinus pressure.   Eyes: Negative for discharge.  Respiratory: Negative for cough.   Cardiovascular: Negative for chest pain.  Gastrointestinal: Negative for abdominal pain and diarrhea.  Genitourinary: Negative for frequency and hematuria.  Musculoskeletal: Negative for back pain.  Skin: Negative for rash.  Neurological: Negative for seizures and headaches.  Psychiatric/Behavioral: Negative for hallucinations.      Allergies  Review of  patient's allergies indicates no known allergies.  Home Medications   Prior to Admission medications   Medication Sig Start Date End Date Taking? Authorizing Provider  Cyanocobalamin (B-12 PO) Take 1 tablet by mouth daily.    Historical Provider, MD  ibuprofen (ADVIL,MOTRIN) 200 MG tablet Take 200 mg by mouth once.    Historical Provider, MD  ibuprofen (ADVIL,MOTRIN) 800 MG tablet Take 1 tablet (800 mg total) by mouth 3 (three) times daily. 11/17/14   Shawn C Joy, PA-C   BP 118/61 mmHg  Pulse 91  Temp(Src) 98 F (36.7 C) (Oral)  Resp 14  Ht 6' (1.829 m)  Wt 155 lb (70.308 kg)  BMI 21.02 kg/m2  SpO2 97% Physical Exam  Constitutional: He is oriented to person, place, and time. He appears well-developed.  HENT:  Head: Normocephalic.  Eyes: Conjunctivae and EOM are normal. No scleral icterus.  Neck: Neck supple. No thyromegaly present.  Cardiovascular: Normal rate and regular rhythm.  Exam reveals no gallop and no friction rub.   No murmur heard. Pulmonary/Chest: No stridor. He has no wheezes. He has no rales. He exhibits no tenderness.  Abdominal: He exhibits no distension. There is no tenderness. There is no rebound.  Genitourinary:  Rectal exam normal no foreign body  Musculoskeletal: Normal range of motion. He exhibits no edema.  Lymphadenopathy:    He has no cervical adenopathy.  Neurological: He is oriented to person, place, and time. He exhibits normal muscle tone. Coordination normal.  Patient moderately lethargic  Skin: No rash noted. No erythema.  Psychiatric:  He has a normal mood and affect. His behavior is normal.    ED Course  Procedures (including critical care time) Labs Review Labs Reviewed  CBC WITH DIFFERENTIAL/PLATELET - Abnormal; Notable for the following:    WBC 13.7 (*)    Neutro Abs 12.3 (*)    All other components within normal limits  COMPREHENSIVE METABOLIC PANEL - Abnormal; Notable for the following:    Chloride 96 (*)    Glucose, Bld 187 (*)     AST 42 (*)    All other components within normal limits  URINE RAPID DRUG SCREEN, HOSP PERFORMED - Abnormal; Notable for the following:    Opiates POSITIVE (*)    Cocaine POSITIVE (*)    Amphetamines POSITIVE (*)    Tetrahydrocannabinol POSITIVE (*)    All other components within normal limits    Imaging Review Dg Chest Portable 1 View  06/11/2015  CLINICAL DATA:  Shortness of breath. EXAM: PORTABLE CHEST 1 VIEW COMPARISON:  March 09, 2015. FINDINGS: The heart size and mediastinal contours are within normal limits. Both lungs are clear. No pneumothorax or pleural effusion is noted. The visualized skeletal structures are unremarkable. IMPRESSION: No acute cardiopulmonary abnormality seen. Electronically Signed   By: Lupita Raider, M.D.   On: 06/11/2015 16:54   Dg Abd Portable 1v  06/11/2015  CLINICAL DATA:  Abdominal pain. EXAM: PORTABLE ABDOMEN - 1 VIEW COMPARISON:  None. FINDINGS: The bowel gas pattern is normal. No radio-opaque calculi or other significant radiographic abnormality are seen. IMPRESSION: No evidence of bowel obstruction or ileus. Electronically Signed   By: Lupita Raider, M.D.   On: 06/11/2015 17:01   I have personally reviewed and evaluated these images and lab results as part of my medical decision-making.   EKG Interpretation None     CRITICAL CARE Performed by: Sadler Teschner L Total critical care time: 40 minutes Critical care time was exclusive of separately billable procedures and treating other patients. Critical care was necessary to treat or prevent imminent or life-threatening deterioration. Critical care was time spent personally by me on the following activities: development of treatment plan with patient and/or surrogate as well as nursing, discussions with consultants, evaluation of patient's response to treatment, examination of patient, obtaining history from patient or surrogate, ordering and performing treatments and interventions, ordering and  review of laboratory studies, ordering and review of radiographic studies, pulse oximetry and re-evaluation of patient's condition.  MDM   Final diagnoses:  Overdose, accidental or unintentional, initial encounter   Fourth control consult to and stated patient should get charcoal. Should be admitted to observation and released once he goes 6 hours without narcan.    Patient given 2 mg of Narcan here 1 week he became lethargic.     Bethann Berkshire, MD 06/11/15 [phone]

## 2015-06-12 DIAGNOSIS — G4700 Insomnia, unspecified: Secondary | ICD-10-CM | POA: Diagnosis present

## 2015-06-12 DIAGNOSIS — X58XXXA Exposure to other specified factors, initial encounter: Secondary | ICD-10-CM | POA: Diagnosis present

## 2015-06-12 DIAGNOSIS — F411 Generalized anxiety disorder: Secondary | ICD-10-CM | POA: Diagnosis present

## 2015-06-12 DIAGNOSIS — D72829 Elevated white blood cell count, unspecified: Secondary | ICD-10-CM

## 2015-06-12 DIAGNOSIS — T401X1A Poisoning by heroin, accidental (unintentional), initial encounter: Secondary | ICD-10-CM | POA: Diagnosis present

## 2015-06-12 DIAGNOSIS — T185XXD Foreign body in anus and rectum, subsequent encounter: Secondary | ICD-10-CM

## 2015-06-12 DIAGNOSIS — T185XXA Foreign body in anus and rectum, initial encounter: Secondary | ICD-10-CM | POA: Diagnosis not present

## 2015-06-12 DIAGNOSIS — F1510 Other stimulant abuse, uncomplicated: Secondary | ICD-10-CM | POA: Diagnosis present

## 2015-06-12 DIAGNOSIS — I471 Supraventricular tachycardia: Secondary | ICD-10-CM | POA: Diagnosis present

## 2015-06-12 DIAGNOSIS — F319 Bipolar disorder, unspecified: Secondary | ICD-10-CM | POA: Diagnosis present

## 2015-06-12 DIAGNOSIS — K648 Other hemorrhoids: Secondary | ICD-10-CM | POA: Diagnosis not present

## 2015-06-12 DIAGNOSIS — F329 Major depressive disorder, single episode, unspecified: Secondary | ICD-10-CM | POA: Diagnosis not present

## 2015-06-12 DIAGNOSIS — F1410 Cocaine abuse, uncomplicated: Secondary | ICD-10-CM | POA: Diagnosis present

## 2015-06-12 DIAGNOSIS — R112 Nausea with vomiting, unspecified: Secondary | ICD-10-CM | POA: Diagnosis not present

## 2015-06-12 DIAGNOSIS — R739 Hyperglycemia, unspecified: Secondary | ICD-10-CM | POA: Diagnosis present

## 2015-06-12 DIAGNOSIS — F1210 Cannabis abuse, uncomplicated: Secondary | ICD-10-CM | POA: Diagnosis present

## 2015-06-12 DIAGNOSIS — F17200 Nicotine dependence, unspecified, uncomplicated: Secondary | ICD-10-CM | POA: Diagnosis present

## 2015-06-12 DIAGNOSIS — I468 Cardiac arrest due to other underlying condition: Secondary | ICD-10-CM | POA: Diagnosis present

## 2015-06-12 DIAGNOSIS — F1110 Opioid abuse, uncomplicated: Secondary | ICD-10-CM | POA: Diagnosis present

## 2015-06-12 DIAGNOSIS — F1910 Other psychoactive substance abuse, uncomplicated: Secondary | ICD-10-CM | POA: Diagnosis not present

## 2015-06-12 DIAGNOSIS — R4182 Altered mental status, unspecified: Secondary | ICD-10-CM | POA: Diagnosis present

## 2015-06-12 LAB — GLUCOSE, CAPILLARY
GLUCOSE-CAPILLARY: 107 mg/dL — AB (ref 65–99)
GLUCOSE-CAPILLARY: 106 mg/dL — AB (ref 65–99)
GLUCOSE-CAPILLARY: 97 mg/dL (ref 65–99)

## 2015-06-12 LAB — MRSA PCR SCREENING: MRSA BY PCR: POSITIVE — AB

## 2015-06-12 MED ORDER — CETYLPYRIDINIUM CHLORIDE 0.05 % MT LIQD
7.0000 mL | Freq: Two times a day (BID) | OROMUCOSAL | Status: DC
  Administered 2015-06-12 – 2015-06-14 (×5): 7 mL via OROMUCOSAL

## 2015-06-12 MED ORDER — PEG 3350-KCL-NA BICARB-NACL 420 G PO SOLR
4000.0000 mL | Freq: Once | ORAL | Status: AC
  Administered 2015-06-12: 4000 mL via ORAL
  Filled 2015-06-12: qty 4000

## 2015-06-12 MED ORDER — MUPIROCIN 2 % EX OINT
1.0000 "application " | TOPICAL_OINTMENT | Freq: Two times a day (BID) | CUTANEOUS | Status: DC
  Administered 2015-06-12 – 2015-06-14 (×5): 1 via NASAL
  Filled 2015-06-12: qty 22

## 2015-06-12 MED ORDER — CHLORHEXIDINE GLUCONATE CLOTH 2 % EX PADS
6.0000 | MEDICATED_PAD | Freq: Every day | CUTANEOUS | Status: DC
  Administered 2015-06-12 – 2015-06-14 (×3): 6 via TOPICAL

## 2015-06-12 NOTE — Consult Note (Signed)
Reason for Consult:FB in rectum. Referring Physician: Admitted thru the ED yesterday. Patient cannot tell events that led up to admission. He thinks he overdosed on heroin. He says he has a bag of heroin in his rectum. He was hiding the heroin so he would not get caught with it.  He says the police were at his house.He was in custody at the jail when he apparently had a cardiac arrest.  He thinks the heroin leaked out of the bag. He has two small zip lock bags in his rectum.  He has not had a BM since admission .Andrew Huffman He has been doing heroin for 2 years.  He says there was a gram 1/2 in his rectum. About the size of a marble.  He apparently had a respiratory arrest before he came to hospital and was resuscitated.  Deputy present in room.   JAMARCUS LADUKE is an 30 y.o. male.   Past Medical History  Diagnosis Date  . Bipolar 1 disorder ([place])   . Anxiety   . Outbursts of anger   . Drug abuse, opioid type     Past Surgical History  Procedure Laterality Date  . Hernia repair    . Appendectomy      History reviewed. No pertinent family history.  Social History:  reports that he has been smoking.  He does not have any smokeless tobacco history on file. He reports that he uses illicit drugs (IV). He reports that he does not drink alcohol.  Allergies: No Known Allergies  Medications: I have reviewed the patient's current medications.  Results for orders placed or performed during the hospital encounter of 06/11/15 (from the past 48 hour(s))  CBC with Differential/Platelet     Status: Abnormal   Collection Time: 06/11/15  4:38 PM  Result Value Ref Range   WBC 13.7 (H) 4.0 - 10.5 K/uL   RBC 5.00 4.22 - 5.81 MIL/uL   Hemoglobin 14.2 13.0 - 17.0 g/dL   HCT 44.3 39.0 - 52.0 %   MCV 88.6 78.0 - 100.0 fL   MCH 28.4 26.0 - 34.0 pg   MCHC 32.1 30.0 - 36.0 g/dL   RDW 12.6 11.5 - 15.5 %   Platelets 150 150 - 400 K/uL   Neutrophils Relative % 90 %   Neutro Abs 12.3 (H) 1.7 - 7.7 K/uL    Lymphocytes Relative 5 %   Lymphs Abs 0.7 0.7 - 4.0 K/uL   Monocytes Relative 5 %   Monocytes Absolute 0.7 0.1 - 1.0 K/uL   Eosinophils Relative 0 %   Eosinophils Absolute 0.1 0.0 - 0.7 K/uL   Basophils Relative 0 %   Basophils Absolute 0.0 0.0 - 0.1 K/uL  Comprehensive metabolic panel     Status: Abnormal   Collection Time: 06/11/15  4:38 PM  Result Value Ref Range   Sodium 135 135 - 145 mmol/L   Potassium 4.0 3.5 - 5.1 mmol/L   Chloride 96 (L) 101 - 111 mmol/L   CO2 30 22 - 32 mmol/L   Glucose, Bld 187 (H) 65 - 99 mg/dL   BUN 13 6 - 20 mg/dL   Creatinine, Ser 0.93 0.61 - 1.24 mg/dL   Calcium 9.0 8.9 - 10.3 mg/dL   Total Protein 7.4 6.5 - 8.1 g/dL   Albumin 4.1 3.5 - 5.0 g/dL   AST 42 (H) 15 - 41 U/L   ALT 44 17 - 63 U/L   Alkaline Phosphatase 72 38 - 126 U/L   Total  Bilirubin 0.4 0.3 - 1.2 mg/dL   GFR calc non Af Amer >60 >60 mL/min   GFR calc Af Amer >60 >60 mL/min    Comment: (NOTE) The eGFR has been calculated using the CKD EPI equation. This calculation has not been validated in all clinical situations. eGFR's persistently <60 mL/min signify possible Chronic Kidney Disease.    Anion gap 9 5 - 15  Urine rapid drug screen (hosp performed)     Status: Abnormal   Collection Time: 06/11/15  5:00 PM  Result Value Ref Range   Opiates POSITIVE (A) NONE DETECTED   Cocaine POSITIVE (A) NONE DETECTED   Benzodiazepines NONE DETECTED NONE DETECTED   Amphetamines POSITIVE (A) NONE DETECTED   Tetrahydrocannabinol POSITIVE (A) NONE DETECTED   Barbiturates NONE DETECTED NONE DETECTED    Comment:        DRUG SCREEN FOR MEDICAL PURPOSES ONLY.  IF CONFIRMATION IS NEEDED FOR ANY PURPOSE, NOTIFY LAB WITHIN 5 DAYS.        LOWEST DETECTABLE LIMITS FOR URINE DRUG SCREEN Drug Class       Cutoff (ng/mL) Amphetamine      1000 Barbiturate      200 Benzodiazepine   341 Tricyclics       962 Opiates          300 Cocaine          300 THC              50   Glucose, capillary      Status: None   Collection Time: 06/11/15  8:40 PM  Result Value Ref Range   Glucose-Capillary 85 65 - 99 mg/dL   Comment 1 Notify RN    Comment 2 Document in Chart   MRSA PCR Screening     Status: Abnormal   Collection Time: 06/12/15  5:30 AM  Result Value Ref Range   MRSA by PCR POSITIVE (A) NEGATIVE    Comment: RESULT CALLED TO, READ BACK BY AND VERIFIED WITH: BROWER,B AT 2297 ON 06/12/2015 BY ISLEY,B        The GeneXpert MRSA Assay (FDA approved for NASAL specimens only), is one component of a comprehensive MRSA colonization surveillance program. It is not intended to diagnose MRSA infection nor to guide or monitor treatment for MRSA infections.   Glucose, capillary     Status: Abnormal   Collection Time: 06/12/15  7:35 AM  Result Value Ref Range   Glucose-Capillary 107 (H) 65 - 99 mg/dL  Glucose, capillary     Status: Abnormal   Collection Time: 06/12/15 11:24 AM  Result Value Ref Range   Glucose-Capillary 106 (H) 65 - 99 mg/dL    Ct Pelvis Wo Contrast  06/12/2015  CLINICAL DATA:  Arrested yesterday at 1300 hours, put a 1.5 g bag of heroin in his rectum just before arrest, today found unresponsive and blue, resuscitated with Narcan, question retained foreign body in rectum EXAM: CT PELVIS WITHOUT CONTRAST TECHNIQUE: Multidetector CT imaging of the pelvis was performed following the standard protocol without intravenous contrast. Sagittal and coronal MPR images reconstructed from axial data set. COMPARISON:  10/18/2006 FINDINGS: No definite radiopaque foreign bodies identified7. Large amount of gas within rectum. On lung windows however, a faint opacity is seen within the air of the rectum, measuring approximately 4.5 x 3.6 x 2.8 cm in size. This demonstrates a somewhat curvilinear layered configuration, and potentially could represent a retained fairly radiolucent foreign body. Foley catheter decompresses urinary bladder. Pelvic bowel loops and  appendix normal appearance. Osseous  structures unremarkable. IMPRESSION: Fairly radiolucent focus within gas filled rectum, 4.5 x 3.6 x 2.8 cm in size; this is of uncertain etiology could but could potentially represent a retained radiolucent foreign body. Electronically Signed   By: Lavonia Dana M.D.   On: 06/12/2015 00:19   Dg Chest Portable 1 View  06/11/2015  CLINICAL DATA:  Shortness of breath. EXAM: PORTABLE CHEST 1 VIEW COMPARISON:  March 09, 2015. FINDINGS: The heart size and mediastinal contours are within normal limits. Both lungs are clear. No pneumothorax or pleural effusion is noted. The visualized skeletal structures are unremarkable. IMPRESSION: No acute cardiopulmonary abnormality seen. Electronically Signed   By: Marijo Conception, M.D.   On: 06/11/2015 16:54   Dg Abd Portable 1v  06/11/2015  CLINICAL DATA:  Abdominal pain. EXAM: PORTABLE ABDOMEN - 1 VIEW COMPARISON:  None. FINDINGS: The bowel gas pattern is normal. No radio-opaque calculi or other significant radiographic abnormality are seen. IMPRESSION: No evidence of bowel obstruction or ileus. Electronically Signed   By: Marijo Conception, M.D.   On: 06/11/2015 17:01    Review of Systems  Neurological: Positive for weakness.   Blood pressure 125/50, pulse 75, temperature 97.8 F (36.6 C), temperature source Oral, resp. rate 20, height _0  (1.905 m), weight 147 lb 1.6 oz (66.724 kg), SpO2 99 %. Physical Exam Alert and oriented. Skin warm and dry. Oral mucosa is moist.   . Sclera anicteric, conjunctivae is pink. Thyroid not enlarged. No cervical lymphadenopathy. Lungs clear. Heart regular rate and rhythm.  Abdomen is soft. Bowel sounds are positive. No hepatomegaly. No abdominal masses felt. No tenderness.  No edema to lower extremities. Assessment/Plan: FB in rectum x 2 containing 1 1/2 gms of heroin. Andrew KitchenHe has not had a BM since admission. I will discuss with Dr. Laural Golden.  Possible sigmoidoscopy SETZER,TERRI W 06/12/2015, 1:32 PM

## 2015-06-12 NOTE — Progress Notes (Signed)
PROGRESS NOTE    Andrew Huffman  ZOX:[id number] DOB: 02-18-1985 DOA: 06/11/2015 PCP: No primary care provider on file.   Brief Narrative: 56 yom with medical history of bipolar disorder, anxiety, and polysubstance abuse presented from jail with complaints of  cardiopulmonary arrest secondary to to heroin overdose. He required Narcan, which improved symptoms. Upon exam in the ED his UDS was positive for amphetamines, cocaine, THC, and opiates. Although rectal exam was negative for foreign body, patient reported heroin in his rectum, which was confirmed by CT A/P. Per poison control was contacted and recommended observation. He has been referred for admission.   Assessment & Plan:  Principal Problem:   Heroin overdose Active Problems:   Leukocytosis   Paroxysmal supraventricular tachycardia (HCC)   Nausea with vomiting   Tachycardia   Anxiety state   Depression   Drug abuse   Hyperglycemia   Foreign body anus/rectum   Overdose  1. Opiate overdose with cardiopulmonary arrest, secondary to unintentional overdose. He received Narcan at the facility that immediately improved his symptoms. Poison control advised monitoring. Overdose was unintentional and pt denies SI.  2. Rectal foreign body. Pt reported placing 1.5g of heroin in rectum. Not felt on rectal exam. Revealed on CT A/P. Will consult GI.  3. Polysubstance abuse. UDS positive for THD, amphetamines. Cocaine, and opiates. SW consulted for resources.  4. Leukocytosis, mild. No source of infection. Likely secondary to stress and post-code setting.  5. Depression, insomnia and bipolar disorder. Not on any antipsychotics. Pt denied SI, HI, hallucinations or intentional overdose. Continue current Celea and trazodone.   DVT prophylaxis: Lovenox Code Status: Full Family Communication: Family bedside.  Disposition Plan: Anticipate discharge in 24-48 hours.    Consultants:   None  Procedures:    None  Antimicrobials:  None   Subjective: Feels better then yesterday. Denies bowel movement.   Objective: Filed Vitals:   06/11/15 1830 06/11/15 1944 06/11/15 2047 06/12/15 0718  BP: 117/57 135/46 116/61 125/50  Pulse: 80 76 76 75  Temp:  98.5 F (36.9 C) 98.4 F (36.9 C) 97.8 F (36.6 C)  TempSrc:  Oral Oral Oral  Resp: 13 16 20 20   Height:  6\' 3"  (1.905 m)    Weight:  66.724 kg (147 lb 1.6 oz)    SpO2: 100% 93% 98% 99%    Intake/Output Summary (Last 24 hours) at 06/12/15 0843 Last data filed at 06/12/15 0721  Gross per 24 hour  Intake      0 ml  Output   1100 ml  Net  -1100 ml   Filed Weights   06/11/15 1604 06/11/15 1944  Weight: 70.308 kg (155 lb) 66.724 kg (147 lb 1.6 oz)    Examination:  General exam: NAD.  Respiratory system: Clear to auscultation. Respiratory effort normal. Cardiovascular system: S1 & S2 heard, RRR. No JVD, murmurs, rubs, gallops or clicks. No pedal edema. Gastrointestinal system: Abdomen is nondistended, soft and nontender. No organomegaly or masses felt. Normal bowel     Data Reviewed: I have personally reviewed following labs and imaging studies  CBC:  Recent Labs Lab 06/11/15 1638  WBC 13.7*  NEUTROABS 12.3*  HGB 14.2  HCT 44.3  MCV 88.6  PLT 150   Basic Metabolic Panel:  Recent Labs Lab 06/11/15 1638  NA 135  K 4.0  CL 96*  CO2 30  GLUCOSE 187*  BUN 13  CREATININE 0.93  CALCIUM 9.0  Liver Function Tests:  Recent Labs Lab 06/11/15 1638  AST  42*  ALT 44  ALKPHOS 72  BILITOT 0.4  PROT 7.4  ALBUMIN 4.1  CBG:  Recent Labs Lab 06/11/15 2040 06/12/15 0735  GLUCAP 85 107*   Urine analysis:    Component Value Date/Time   COLORURINE AMBER BIOCHEMICALS MAY BE AFFECTED BY COLOR* 10/18/2006 1359   APPEARANCEUR CLEAR 10/18/2006 1359   LABSPEC 1.017 10/18/2006 1359   PHURINE 6.5 10/18/2006 1359   GLUCOSEU NEGATIVE 10/18/2006 1359   HGBUR NEGATIVE 10/18/2006 1359   BILIRUBINUR SMALL* 10/18/2006  1359   KETONESUR TRACE* 10/18/2006 1359   PROTEINUR 30* 10/18/2006 1359   UROBILINOGEN 4.0* 10/18/2006 1359   NITRITE NEGATIVE 10/18/2006 1359   LEUKOCYTESUR NEGATIVE 10/18/2006 1359   Sepsis Labs: @LABRCNTIP (procalcitonin:4,lacticidven:4)  )No results found for this or any previous visit (from the past 240 hour(s)).       Radiology Studies: Ct Pelvis Wo Contrast  06/12/2015  CLINICAL DATA:  Arrested yesterday at 1300 hours, put a 1.5 g bag of heroin in his rectum just before arrest, today found unresponsive and blue, resuscitated with Narcan, question retained foreign body in rectum EXAM: CT PELVIS WITHOUT CONTRAST TECHNIQUE: Multidetector CT imaging of the pelvis was performed following the standard protocol without intravenous contrast. Sagittal and coronal MPR images reconstructed from axial data set. COMPARISON:  10/18/2006 FINDINGS: No definite radiopaque foreign bodies identified7. Large amount of gas within rectum. On lung windows however, a faint opacity is seen within the air of the rectum, measuring approximately 4.5 x 3.6 x 2.8 cm in size. This demonstrates a somewhat curvilinear layered configuration, and potentially could represent a retained fairly radiolucent foreign body. Foley catheter decompresses urinary bladder. Pelvic bowel loops and appendix normal appearance. Osseous structures unremarkable. IMPRESSION: Fairly radiolucent focus within gas filled rectum, 4.5 x 3.6 x 2.8 cm in size; this is of uncertain etiology could but could potentially represent a retained radiolucent foreign body. Electronically Signed   By: Ulyses SouthwardMark  Boles M.D.   On: 06/12/2015 00:19   Dg Chest Portable 1 View  06/11/2015  CLINICAL DATA:  Shortness of breath. EXAM: PORTABLE CHEST 1 VIEW COMPARISON:  March 09, 2015. FINDINGS: The heart size and mediastinal contours are within normal limits. Both lungs are clear. No pneumothorax or pleural effusion is noted. The visualized skeletal structures are  unremarkable. IMPRESSION: No acute cardiopulmonary abnormality seen. Electronically Signed   By: Lupita RaiderJames  Green Jr, M.D.   On: 06/11/2015 16:54   Dg Abd Portable 1v  06/11/2015  CLINICAL DATA:  Abdominal pain. EXAM: PORTABLE ABDOMEN - 1 VIEW COMPARISON:  None. FINDINGS: The bowel gas pattern is normal. No radio-opaque calculi or other significant radiographic abnormality are seen. IMPRESSION: No evidence of bowel obstruction or ileus. Electronically Signed   By: Lupita RaiderJames  Green Jr, M.D.   On: 06/11/2015 17:01        Scheduled Meds: . antiseptic oral rinse  7 mL Mouth Rinse BID  . citalopram  20 mg Oral Daily  . enoxaparin (LOVENOX) injection  40 mg Subcutaneous Q24H  . insulin aspart  0-9 Units Subcutaneous TID WC  . sodium chloride flush  3 mL Intravenous Q12H  . sodium chloride flush  3 mL Intravenous Q12H  . traZODone  150 mg Oral QHS   Continuous Infusions:    Time spent: 20 minutes    Erick BlinksMemon, Jehanzeb, MD  [facility] Pager [phone]  If 7PM-7AM, please contact night-coverage [email or web address] Password [email or web address] 06/12/2015, 8:43 AM    By signing my name below, I, Zadie CleverlyJessica Augustus, attest that this  documentation has been prepared under the direction and in the presence of Erick Blinks, MD. Electronically signed: Zadie Cleverly, Scribe. 06/12/2015 1:15pm     I, Dr. Erick Blinks, personally performed the services described in this documentaiton. All medical record entries made by the scribe were at my direction and in my presence. I have reviewed the chart and agree that the record reflects my personal performance and is accurate and complete  Erick Blinks, MD, 06/12/2015 1:35 PM

## 2015-06-12 NOTE — Progress Notes (Signed)
Lab called with results of MRSA PCR +.  Bactroban, CHG wipes, and contact precautions ordered and initiated.   CHG wipes used and Bactroban ointment applied to nostrils per order.  Pt did not want full bath at this time.  Along with CHG wipes, foley care was completed.

## 2015-06-12 NOTE — Clinical Social Work Note (Signed)
Clinical Social Work Assessment  Patient Details  Name: Andrew Huffman MRN: [id number] Date of Birth: 02/09/1985  Date of referral:  06/12/15               Reason for consult:  Discharge Planning                Permission sought to share information with:    Permission granted to share information::     Name::        Agency::     Relationship::     Contact Information:     Housing/Transportation Living arrangements for the past 2 months:  Art[employer], Hotel/Motel Source of Information:  Patient Patient Interpreter Needed:  None Criminal Activity/Legal Involvement Pertinent to Current Situation/Hospitalization:  Yes (pt will return to jail) Significant Relationships:  Parents Lives with:  Self Do you feel safe going back to the place where you live?  Yes Need for family participation in patient care:  No (Coment)  Care giving concerns:  None.    Social Worker assessment / plan:  CSW met with pt at bedside with [facility] present. Pt reports that he was arrested yesterday and overdosed after. He admits to using IV heroin yesterday and also placed a bag of heroin in his anus prior to arrest. Pt reports a history of 6-8 years of drug use. 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 Pt denies ETOH use. He shared that he overdosed several months ago as well and went to jail for about 2 months after that hospital admission. Pt states he was sober for awhile and then relapsed about one week ago. He has been to outpatient treatment at [facility], but never inpatient. Pt feels that for him to be successful he needs inpatient treatment. CSW provided resources for this as pt will return to jail at d/c from hospital. CSW discussed severity of overdose and pt acknowledges that he understands that outcome could have been different. He shared that his parents want him to get treatment and he is aware that this has been very difficult for them as well. Pt has a 31 year old  daughter in foster care.   Employment status:    Insurance information:    PT Recommendations:  Not assessed at this time Information / Referral to community resources:  Outpatient Substance Abuse Treatment Options, Residential Substance Abuse Treatment Options  Patient/Family's Response to care:  Pt will return to jail at d/c and states he will follow up with inpatient treatment options when released.   Patient/Family's Understanding of and Emotional Response to Diagnosis, Current Treatment, and Prognosis:  Pt reports understanding of events yesterday and indicates that he is processing everything, but knows he needs to change. He states he recognizes that he could have died due to overdose.   Emotional Assessment Appearance:  Appears stated age Attitude/Demeanor/Rapport:  Other (Cooperative) Affect (typically observed):  Other (processing) Orientation:  Oriented to Self, Oriented to Place, Oriented to  Time, Oriented to Situation Alcohol / Substance use:  Illicit Drugs Psych involvement (Current and /or in the community):  No (Comment)  Discharge Needs  Concerns to be addressed:  No discharge needs identified Readmission within the last 30 days:  No Current discharge risk:  Substance Abuse Barriers to Discharge:  No Barriers Identified   [name], [place] 06/12/2015, 9:53 AM [phone]

## 2015-06-13 DIAGNOSIS — F411 Generalized anxiety disorder: Secondary | ICD-10-CM

## 2015-06-13 DIAGNOSIS — F1910 Other psychoactive substance abuse, uncomplicated: Secondary | ICD-10-CM

## 2015-06-13 LAB — GLUCOSE, CAPILLARY
Glucose-Capillary: 105 mg/dL — ABNORMAL HIGH (ref 65–99)
Glucose-Capillary: 80 mg/dL (ref 65–99)
Glucose-Capillary: 127 mg/dL — ABNORMAL HIGH (ref 65–99)
Glucose-Capillary: 90 mg/dL (ref 65–99)

## 2015-06-13 LAB — HEMOGLOBIN A1C
Hgb A1c MFr Bld: 5.5 % (ref 4.8–5.6)
MEAN PLASMA GLUCOSE: 111 mg/dL

## 2015-06-13 MED ORDER — BISACODYL 5 MG PO TBEC
10.0000 mg | DELAYED_RELEASE_TABLET | ORAL | Status: AC
  Administered 2015-06-13: 10 mg via ORAL

## 2015-06-13 NOTE — Care Management Note (Signed)
Case Management Note  Patient Details  Name: Andrew Huffman MRN: [id number] Date of Birth: [birth year]  Subjective/Objective:                  Pt admitted with OD. [facility] CSW has been consulted for drug abuse. Pt plans to return to jail at DC.   Action/Plan: No CM needs anticipated.   Expected Discharge Date:  06/12/15               Expected Discharge Plan:  [facility]rections Facility  In-House Referral:  Clinical Social Work  Discharge planning Services  CM Consult  Post Acute Care Choice:  NA Choice offered to:  NA  DME Arranged:    DME Agency:     HH Arranged:    HH Agency:     Status of Service:  Completed, signed off  Medicare Important Message Given:    Date Medicare IM Given:    Medicare IM give by:    Date Additional Medicare IM Given:    Additional Medicare Important Message give by:     If discussed at Long Length of Stay Meetings, dates discussed:    Additional Comments:  Malcolm MetroChildress, Bobie Caris Demske, RN 06/13/2015, 7:43 AM

## 2015-06-13 NOTE — Progress Notes (Signed)
PROGRESS NOTE    Andrew Huffman  ZOX:[id number] DOB: [birth year] DOA: 06/11/2015 PCP: No primary care provider on file.   Brief Narrative: 56 yom with medical history of bipolar disorder, anxiety, and polysubstance abuse presented from jail with complaints of  cardiopulmonary arrest secondary to to heroin overdose. He required Narcan, which improved symptoms. Upon exam in the ED his UDS was positive for amphetamines, cocaine, THC, and opiates. Although rectal exam was negative for foreign body, patient reported heroin in his rectum, which was confirmed by CT A/P. Per poison control was contacted and recommended observation. He has been referred for admission.   Assessment & Plan:  Principal Problem:   Heroin overdose Active Problems:   Leukocytosis   Paroxysmal supraventricular tachycardia (HCC)   Nausea with vomiting   Tachycardia   Anxiety state   Depression   Drug abuse   Hyperglycemia   Foreign body anus/rectum   Overdose  1. Opiate overdose with cardiopulmonary arrest, secondary to unintentional overdose. He received Narcan at the facility that immediately improved his symptoms. Poison control advised monitoring. Overdose was unintentional and pt denies SI.  2. Rectal foreign body. Pt reported placing 1.5g of heroin in rectum. Not felt on rectal exam. Revealed on CT A/P.  GI consulted and plan for purge with GoLYTELY, if unsuccessful will undergo flexible sigmoidoscopy.   3. Polysubstance abuse. UDS positive for THD, amphetamines. Cocaine, and opiates.  4. Leukocytosis, mild. No source of infection. Likely secondary to stress in post-code setting.  5. Depression, insomnia and bipolar disorder. Not on any antipsychotics. Pt denied SI, HI, hallucinations or intentional overdose. Continue current Celexa and trazodone.   DVT prophylaxis: Lovenox Code Status: Full Family Communication: discussed with patient.   Disposition Plan: Anticipate discharge in 24-48 hours.     Consultants:   GI  Procedures:   None  Antimicrobials:  None   Subjective: No new complaints. Feeling better. Had formed bowel movement earlier today that did not contain any packets.  Objective: Filed Vitals:   06/12/15 0718 06/12/15 1417 06/12/15 2232 06/13/15 0519  BP: 125/50 124/59 146/80 128/68  Pulse: 75 65 77 57  Temp: 97.8 F (36.6 C) 98.4 F (36.9 C) 98.3 F (36.8 C) 97.5 F (36.4 C)  TempSrc: Oral Oral Oral Oral  Resp: Height:      Weight:      SpO2: 99% 98% 97% 99%    Intake/Output Summary (Last 24 hours) at 06/13/15 0801 Last data filed at 06/13/15 0505  Gross per 24 hour  Intake    960 ml  Output   2575 ml  Net  -1615 ml   Filed Weights   06/11/15 1604 06/11/15 1944  Weight: 70.308 kg (155 lb) 66.724 kg (147 lb 1.6 oz)    Examination:  General exam: NAD.  Respiratory system: Clear to auscultation. Respiratory effort normal. Cardiovascular system: S1 & S2 heard, RRR. No JVD, murmurs, rubs, gallops or clicks. No pedal edema. Gastrointestinal system: Abdomen is nondistended, soft and nontender. No organomegaly or masses felt. Normal bowel     Data Reviewed: I have personally reviewed following labs and imaging studies  CBC:  Recent Labs Lab 06/11/15 1638  WBC 13.7*  NEUTROABS 12.3*  HGB 14.2  HCT 44.3  MCV 88.6  PLT 150   Basic Metabolic Panel:  Recent Labs Lab 06/11/15 1638  NA 135  K 4.0  CL 96*  CO2 30  GLUCOSE 187*  BUN 13  CREATININE 0.93  CALCIUM  9.0  Liver Function Tests:  Recent Labs Lab 06/11/15 1638  AST 42*  ALT 44  ALKPHOS 72  BILITOT 0.4  PROT 7.4  ALBUMIN 4.1  CBG:  Recent Labs Lab 06/11/15 2040 06/12/15 0735 06/12/15 1124 06/12/15 1649 06/13/15 0751  GLUCAP 85 107* 106* 97 105*   Urine analysis:    Component Value Date/Time   COLORURINE AMBER BIOCHEMICALS MAY BE AFFECTED BY COLOR* 10/18/2006 1359   APPEARANCEUR CLEAR 10/18/2006 1359   LABSPEC 1.017 10/18/2006 1359    PHURINE 6.5 10/18/2006 1359   GLUCOSEU NEGATIVE 10/18/2006 1359   HGBUR NEGATIVE 10/18/2006 1359   BILIRUBINUR SMALL* 10/18/2006 1359   KETONESUR TRACE* 10/18/2006 1359   PROTEINUR 30* 10/18/2006 1359   UROBILINOGEN 4.0* 10/18/2006 1359   NITRITE NEGATIVE 10/18/2006 1359   LEUKOCYTESUR NEGATIVE 10/18/2006 1359   Sepsis Labs: @LABRCNTIP (procalcitonin:4,lacticidven:4)  ) Recent Results (from the past 240 hour(s))  MRSA PCR Screening     Status: Abnormal   Collection Time: 06/12/15  5:30 AM  Result Value Ref Range Status   MRSA by PCR POSITIVE (A) NEGATIVE Final    Comment: RESULT CALLED TO, READ BACK BY AND VERIFIED WITH: BROWER,B AT 0950 ON 06/12/2015 BY ISLEY,B        The GeneXpert MRSA Assay (FDA approved for NASAL specimens only), is one component of a comprehensive MRSA colonization surveillance program. It is not intended to diagnose MRSA infection nor to guide or monitor treatment for MRSA infections.          Radiology Studies: Ct Pelvis Wo Contrast  06/12/2015  CLINICAL DATA:  Arrested yesterday at 1300 hours, put a 1.5 g bag of heroin in his rectum just before arrest, today found unresponsive and blue, resuscitated with Narcan, question retained foreign body in rectum EXAM: CT PELVIS WITHOUT CONTRAST TECHNIQUE: Multidetector CT imaging of the pelvis was performed following the standard protocol without intravenous contrast. Sagittal and coronal MPR images reconstructed from axial data set. COMPARISON:  10/18/2006 FINDINGS: No definite radiopaque foreign bodies identified7. Large amount of gas within rectum. On lung windows however, a faint opacity is seen within the air of the rectum, measuring approximately 4.5 x 3.6 x 2.8 cm in size. This demonstrates a somewhat curvilinear layered configuration, and potentially could represent a retained fairly radiolucent foreign body. Foley catheter decompresses urinary bladder. Pelvic bowel loops and appendix normal appearance.  Osseous structures unremarkable. IMPRESSION: Fairly radiolucent focus within gas filled rectum, 4.5 x 3.6 x 2.8 cm in size; this is of uncertain etiology could but could potentially represent a retained radiolucent foreign body. Electronically Signed   By: Ulyses SouthwardMark  Boles M.D.   On: 06/12/2015 00:19   Dg Chest Portable 1 View  06/11/2015  CLINICAL DATA:  Shortness of breath. EXAM: PORTABLE CHEST 1 VIEW COMPARISON:  March 09, 2015. FINDINGS: The heart size and mediastinal contours are within normal limits. Both lungs are clear. No pneumothorax or pleural effusion is noted. The visualized skeletal structures are unremarkable. IMPRESSION: No acute cardiopulmonary abnormality seen. Electronically Signed   By: Lupita RaiderJames  Green Jr, M.D.   On: 06/11/2015 16:54   Dg Abd Portable 1v  06/11/2015  CLINICAL DATA:  Abdominal pain. EXAM: PORTABLE ABDOMEN - 1 VIEW COMPARISON:  None. FINDINGS: The bowel gas pattern is normal. No radio-opaque calculi or other significant radiographic abnormality are seen. IMPRESSION: No evidence of bowel obstruction or ileus. Electronically Signed   By: Lupita RaiderJames  Green Jr, M.D.   On: 06/11/2015 17:01  Scheduled Meds: . antiseptic oral rinse  7 mL Mouth Rinse BID  . Chlorhexidine Gluconate Cloth  6 each Topical Q0600  . citalopram  20 mg Oral Daily  . enoxaparin (LOVENOX) injection  40 mg Subcutaneous Q24H  . insulin aspart  0-9 Units Subcutaneous TID WC  . mupirocin ointment  1 application Nasal BID  . sodium chloride flush  3 mL Intravenous Q12H  . sodium chloride flush  3 mL Intravenous Q12H  . traZODone  150 mg Oral QHS   Continuous Infusions:    Time spent: 20 minutes    Erick Blinks, MD  [facility] Pager [phone]  If 7PM-7AM, please contact night-coverage [email or web address] Password [email or web address] 06/13/2015, 8:01 AM

## 2015-06-13 NOTE — Progress Notes (Signed)
Patient ID: Andrew Huffman, male   DOB: August 04, 1985, 30 y.o.   MRN: [id number] States he had one BM last night and he says he passed on of the bag containing heroin. Was not reported by night nurse. Encouraged patient this morning to finish the Golytely. May require flex sigmoid this afternoon if he does not pass.

## 2015-06-13 NOTE — Progress Notes (Signed)
Patient is still passing formed stool. He has less than 50% of GoLYTELY remaining. Patient encouraged to drink GoLYTELY as fast as he can. Will not attempt flexible sigmoidoscopy without satisfactory prep. Unless he passes foreign body will proceed with flexible sigmoidoscopy tomorrow as long as he is passing liquid stool.

## 2015-06-13 NOTE — Progress Notes (Signed)
Pt had large, solid bowel movement.  No evidence of foreign objects noted.  Dr Karilyn Cotaehman notified.  Pt placed back on clear liquids and encouraged to finish GoLYtely.  Will continue to monitor.

## 2015-06-14 ENCOUNTER — Encounter (HOSPITAL_COMMUNITY): Admission: EM | Payer: Self-pay | Source: Home / Self Care | Attending: Internal Medicine

## 2015-06-14 ENCOUNTER — Encounter (HOSPITAL_COMMUNITY): Payer: Self-pay | Admitting: *Deleted

## 2015-06-14 DIAGNOSIS — K648 Other hemorrhoids: Secondary | ICD-10-CM

## 2015-06-14 DIAGNOSIS — T185XXA Foreign body in anus and rectum, initial encounter: Secondary | ICD-10-CM

## 2015-06-14 DIAGNOSIS — R112 Nausea with vomiting, unspecified: Secondary | ICD-10-CM

## 2015-06-14 DIAGNOSIS — F1910 Other psychoactive substance abuse, uncomplicated: Secondary | ICD-10-CM

## 2015-06-14 HISTORY — PX: FLEXIBLE SIGMOIDOSCOPY: SHX5431

## 2015-06-14 LAB — GLUCOSE, CAPILLARY
GLUCOSE-CAPILLARY: 109 mg/dL — AB (ref 65–99)
Glucose-Capillary: 107 mg/dL — ABNORMAL HIGH (ref 65–99)
Glucose-Capillary: 108 mg/dL — ABNORMAL HIGH (ref 65–99)

## 2015-06-14 SURGERY — SIGMOIDOSCOPY, FLEXIBLE
Anesthesia: Moderate Sedation

## 2015-06-14 MED ORDER — MIDAZOLAM HCL 5 MG/5ML IJ SOLN
INTRAMUSCULAR | Status: AC
  Filled 2015-06-14: qty 10

## 2015-06-14 MED ORDER — FLEET ENEMA 7-19 GM/118ML RE ENEM
1.0000 | ENEMA | Freq: Once | RECTAL | Status: AC
  Administered 2015-06-14: 1 via RECTAL

## 2015-06-14 MED ORDER — SODIUM CHLORIDE 0.9 % IV SOLN: INTRAVENOUS | Status: DC

## 2015-06-14 MED ORDER — MEPERIDINE HCL 50 MG/ML IJ SOLN
INTRAMUSCULAR | Status: DC
Start: 2015-06-14 — End: 2015-06-14
  Filled 2015-06-14: qty 1

## 2015-06-14 NOTE — Discharge Summary (Signed)
Physician Discharge Summary  Andrew Huffman ZOX:[id number] DOB: 1985/07/05 DOA: 06/11/2015  PCP: No primary care provider on file.  Admit date: 06/11/2015 Discharge date: 06/14/2015  Time spent: 35 minutes  Recommendations for Outpatient Follow-up:  1. Follow up with PCP in 1-2 weeks.     Discharge Diagnoses:  Principal Problem:   Heroin overdose Active Problems:   Leukocytosis   Paroxysmal supraventricular tachycardia (HCC)   Nausea with vomiting   Tachycardia   Anxiety state   Depression   Drug abuse   Hyperglycemia   Foreign body anus/rectum   Overdose   Discharge Condition: Improved   Diet recommendation: Heart healthy  Filed Weights   06/11/15 1604 06/11/15 1944  Weight: 70.308 kg (155 lb) 66.724 kg (147 lb 1.6 oz)    History of present illness:  1 yom with medical history of bipolar disorder, anxiety, and polysubstance abuse presented from jail with complaints of cardiopulmonary arrest secondary to to heroin overdose. He required Narcan, which improved symptoms. Upon exam in the ED his UDS was positive for amphetamines, cocaine, THC, and opiates. Although rectal exam was negative for foreign body, patient reported heroin in his rectum, which was confirmed by CT A/P. Per poison control was contacted and recommended observation. He has been referred for admission.   Hospital Course:  Patient was admitted  after being found unresponsive in his jail cell. It was felt that he may be in cardiopulmonary arrest due to unintentional overdose of heroin. Narcan was provided at the scene with immediate improvement in his symptoms.  Patient intake of cocaine via needle before arrest and hiding 1.5g of heroin in a small bag in his rectum. He reports that the overdose was unintentional and denies SI. Poison control was called and advised monitoring and to not discharge until the bags were retrieved from hs rectum.He appears to have recovered from his overdose without any  significant complications  Patient reported hiding 1.5g of heroin in his rectum before his arrest. On exam in the ED the bags were not felt so CT scan was conducted. CT scan confirmed foreign object in rectum. GI consulted and provided GoLYTLEY to encourage bowel movements. Patient did have multiple bowel movements and reported that the bags of heroin had been purged, although this was not witnessed by staff. GI performed flexible sigmoidoscopy on 5/19 to evaluate for any remaining foreign body. This study was unremarkable and did not reveal any evidence of retained foreign body.. Patient is otherwise stable for discharge 1. Polysubstance abuse. UDS positive for THD, amphetamines. Cocaine, and opiates.  2. Leukocytosis. No source of infection. Likely secondary to stress in post-code setting.  3. Depression, insomnia and bipolar disorder. Not on any antipsychotics. Pt denied SI, HI, hallucinations or intentional overdose. Continue current Celexa and trazodone  Procedures:  Flexible sigmoidoscopy 5/19  Consultations:  GI  Discharge Exam: Filed Vitals:   06/14/15 1625 06/14/15 1630  BP: 123/67 130/77  Pulse: 67 80  Temp:    Resp: 23 19   General: NAD, looks comfortable Cardiovascular: RRR, S1, S2  Respiratory: clear bilaterally, No wheezing, rales or rhonchi Abdomen: soft, non tender, no distention , bowel sounds normal Musculoskeletal: No edema b/l   Discharge Instructions   Discharge Instructions    Diet - low sodium heart healthy    Complete by:  As directed      Increase activity slowly    Complete by:  As directed           Discharge Medication List  as of 06/14/2015  6:06 PM    CONTINUE these medications which have NOT CHANGED   Details  citalopram (CELEXA) 20 MG tablet Take 20 mg by mouth daily., Until Discontinued, Historical Med    traZODone (DESYREL) 150 MG tablet Take 150 mg by mouth at bedtime., Until Discontinued, Historical Med       No Known  Allergies    The results of significant diagnostics from this hospitalization (including imaging, microbiology, ancillary and laboratory) are listed below for reference.    Significant Diagnostic Studies: Ct Pelvis Wo Contrast  06/12/2015  CLINICAL DATA:  Arrested yesterday at 1300 hours, put a 1.5 g bag of heroin in his rectum just before arrest, today found unresponsive and blue, resuscitated with Narcan, question retained foreign body in rectum EXAM: CT PELVIS WITHOUT CONTRAST TECHNIQUE: Multidetector CT imaging of the pelvis was performed following the standard protocol without intravenous contrast. Sagittal and coronal MPR images reconstructed from axial data set. COMPARISON:  10/18/2006 FINDINGS: No definite radiopaque foreign bodies identified7. Large amount of gas within rectum. On lung windows however, a faint opacity is seen within the air of the rectum, measuring approximately 4.5 x 3.6 x 2.8 cm in size. This demonstrates a somewhat curvilinear layered configuration, and potentially could represent a retained fairly radiolucent foreign body. Foley catheter decompresses urinary bladder. Pelvic bowel loops and appendix normal appearance. Osseous structures unremarkable. IMPRESSION: Fairly radiolucent focus within gas filled rectum, 4.5 x 3.6 x 2.8 cm in size; this is of uncertain etiology could but could potentially represent a retained radiolucent foreign body. Electronically Signed   By: Ulyses SouthwardMark  Boles M.D.   On: 06/12/2015 00:19   Dg Chest Portable 1 View  06/11/2015  CLINICAL DATA:  Shortness of breath. EXAM: PORTABLE CHEST 1 VIEW COMPARISON:  March 09, 2015. FINDINGS: The heart size and mediastinal contours are within normal limits. Both lungs are clear. No pneumothorax or pleural effusion is noted. The visualized skeletal structures are unremarkable. IMPRESSION: No acute cardiopulmonary abnormality seen. Electronically Signed   By: Lupita RaiderJames  Green Jr, M.D.   On: 06/11/2015 16:54   Dg Abd  Portable 1v  06/11/2015  CLINICAL DATA:  Abdominal pain. EXAM: PORTABLE ABDOMEN - 1 VIEW COMPARISON:  None. FINDINGS: The bowel gas pattern is normal. No radio-opaque calculi or other significant radiographic abnormality are seen. IMPRESSION: No evidence of bowel obstruction or ileus. Electronically Signed   By: Lupita RaiderJames  Green Jr, M.D.   On: 06/11/2015 17:01    Microbiology: Recent Results (from the past 240 hour(s))  MRSA PCR Screening     Status: Abnormal   Collection Time: 06/12/15  5:30 AM  Result Value Ref Range Status   MRSA by PCR POSITIVE (A) NEGATIVE Final    Comment: RESULT CALLED TO, READ BACK BY AND VERIFIED WITH: BROWER,B AT 0950 ON 06/12/2015 BY ISLEY,B        The GeneXpert MRSA Assay (FDA approved for NASAL specimens only), is one component of a comprehensive MRSA colonization surveillance program. It is not intended to diagnose MRSA infection nor to guide or monitor treatment for MRSA infections.      Labs: Basic Metabolic Panel:  Recent Labs Lab 06/11/15 1638  NA 135  K 4.0  CL 96*  CO2 30  GLUCOSE 187*  BUN 13  CREATININE 0.93  CALCIUM 9.0   Liver Function Tests:  Recent Labs Lab 06/11/15 1638  AST 42*  ALT 44  ALKPHOS 72  BILITOT 0.4  PROT 7.4  ALBUMIN 4.1  CBC:  Recent Labs Lab 06/11/15 1638  WBC 13.7*  NEUTROABS 12.3*  HGB 14.2  HCT 44.3  MCV 88.6  PLT 150    CBG:  Recent Labs Lab 06/13/15 1606 06/13/15 2106 06/14/15 0731 06/14/15 1150 06/14/15 1646  GLUCAP 127* 90 107* 108* 109*   Signed: Erick Blinks, MD   [facility] 06/14/2015, 7:45 PM

## 2015-06-14 NOTE — Progress Notes (Signed)
Patient states he is passing loose stools. He has not passed a second bag i.e. foreign body. We'll therefore proceed with flexible sigmoidoscopy in order to remove foreign body from the rectum. Procedure reviewed with the patient and he is agreeable.

## 2015-06-14 NOTE — Progress Notes (Signed)
Bowel Movement 0825.  Stool checked for foreign body noted at this time.  Will continue to monitor.

## 2015-06-15 NOTE — Progress Notes (Signed)
Patient discharged with instructions, prescription, and care notes.  Verbalized understanding via teach back.  IV was removed and the site was WNL. Patient voiced no further complaints or concerns at the time of discharge.  Appointments scheduled per instructions.  Patient left the floor via w/c with patient and guard in stable condition.

## 2015-06-18 ENCOUNTER — Encounter (HOSPITAL_COMMUNITY): Payer: Self-pay | Admitting: Internal Medicine

## 2015-08-07 NOTE — Op Note (Signed)
[facility] Patient Name: Andrew Huffman Procedure Date: 06/14/2015 4:01 PM MRN: [id number] Date of Birth: December 05, 1985 Attending MD: Lionel December , MD CSN: [id number] Age: 30 Admit Type: Inpatient Procedure:                Flexible Sigmoidoscopy Indications:              Foreign body in the rectum Providers:                Lionel December, MD, Roma Schanz, RN, Tammy                            Vaught, RN, Calton Dach, Technician Referring MD:             Bradly Bienenstock, MD Medicines:                None Complications:            No immediate complications. Estimated Blood Loss:     Estimated blood loss: none. Procedure:                Pre-Anesthesia Assessment:                           - Prior to the procedure, a History and Physical                            was performed, and patient medications and                            allergies were reviewed. The patient's tolerance of                            previous anesthesia was also reviewed. The risks                            and benefits of the procedure and the sedation                            options and risks were discussed with the patient.                            All questions were answered, and informed consent                            was obtained. [Anticoagulant Agents]. [ASA Grade].                            After reviewing the risks and benefits, the patient                            was deemed in satisfactory condition to undergo the                            procedure.                           After obtaining  informed consent, the scope was                            passed under direct vision. The Colonoscope was                            introduced through the anus and advanced to the the                            sigmoid colon. The flexible sigmoidoscopy was                            accomplished without difficulty. The patient                            tolerated the procedure well.  The quality of the                            bowel preparation was adequate to identify polyps 6                            mm and larger in size. Scope In: 4:17:17 PM Scope Out: 4:26:51 PM Total Procedure Duration: 0 hours 9 minutes 34 seconds  Findings:      The rectum, recto-sigmoid colon and sigmoid colon appeared normal.      Non-bleeding internal hemorrhoids were found during retroflexion. The       hemorrhoids were small.      A foreign body was not seen      The entire examined colon appeared normal. Impression:               - normal flexible sigmoidoscopy except internal                            hemorrhoids.                           - No foreign body noted in recum or sigmoid colon                            implying spontaneous passage.                           - No specimens collected.                           - A foreign body was not seen. Moderate Sedation:      not given Recommendation:           - Return patient to hospital ward for possible                            discharge same day.                           - Resume regular diet today.                           -  Continue present medications. Procedure Code(s):        --- Professional ---                           [id number], Sigmoidoscopy, flexible; diagnostic,                            including collection of specimen(s) by brushing or                            washing, when performed (separate procedure) Diagnosis Code(s):        --- Professional ---                           [id number], Foreign body in anus and rectum, initial                            encounter CPT copyright 2016 [employer]. All rights reserved. The codes documented in this report are preliminary and upon coder review may  be revised to meet current compliance requirements. Lionel DecemberNajeeb Jema Deegan, MD Lionel DecemberNajeeb Pieper Kasik, MD 08/07/2015 2:41:42 PM This report has been signed electronically. Number of Addenda: 0

## 2017-08-07 IMAGING — CR DG CHEST 1V PORT
1 series · 1 of 1 positions shown · non-contrast
Comparison: March 09, 2015.

CLINICAL DATA: Shortness of breath.

EXAM:
PORTABLE CHEST 1 VIEW

[ap portable]
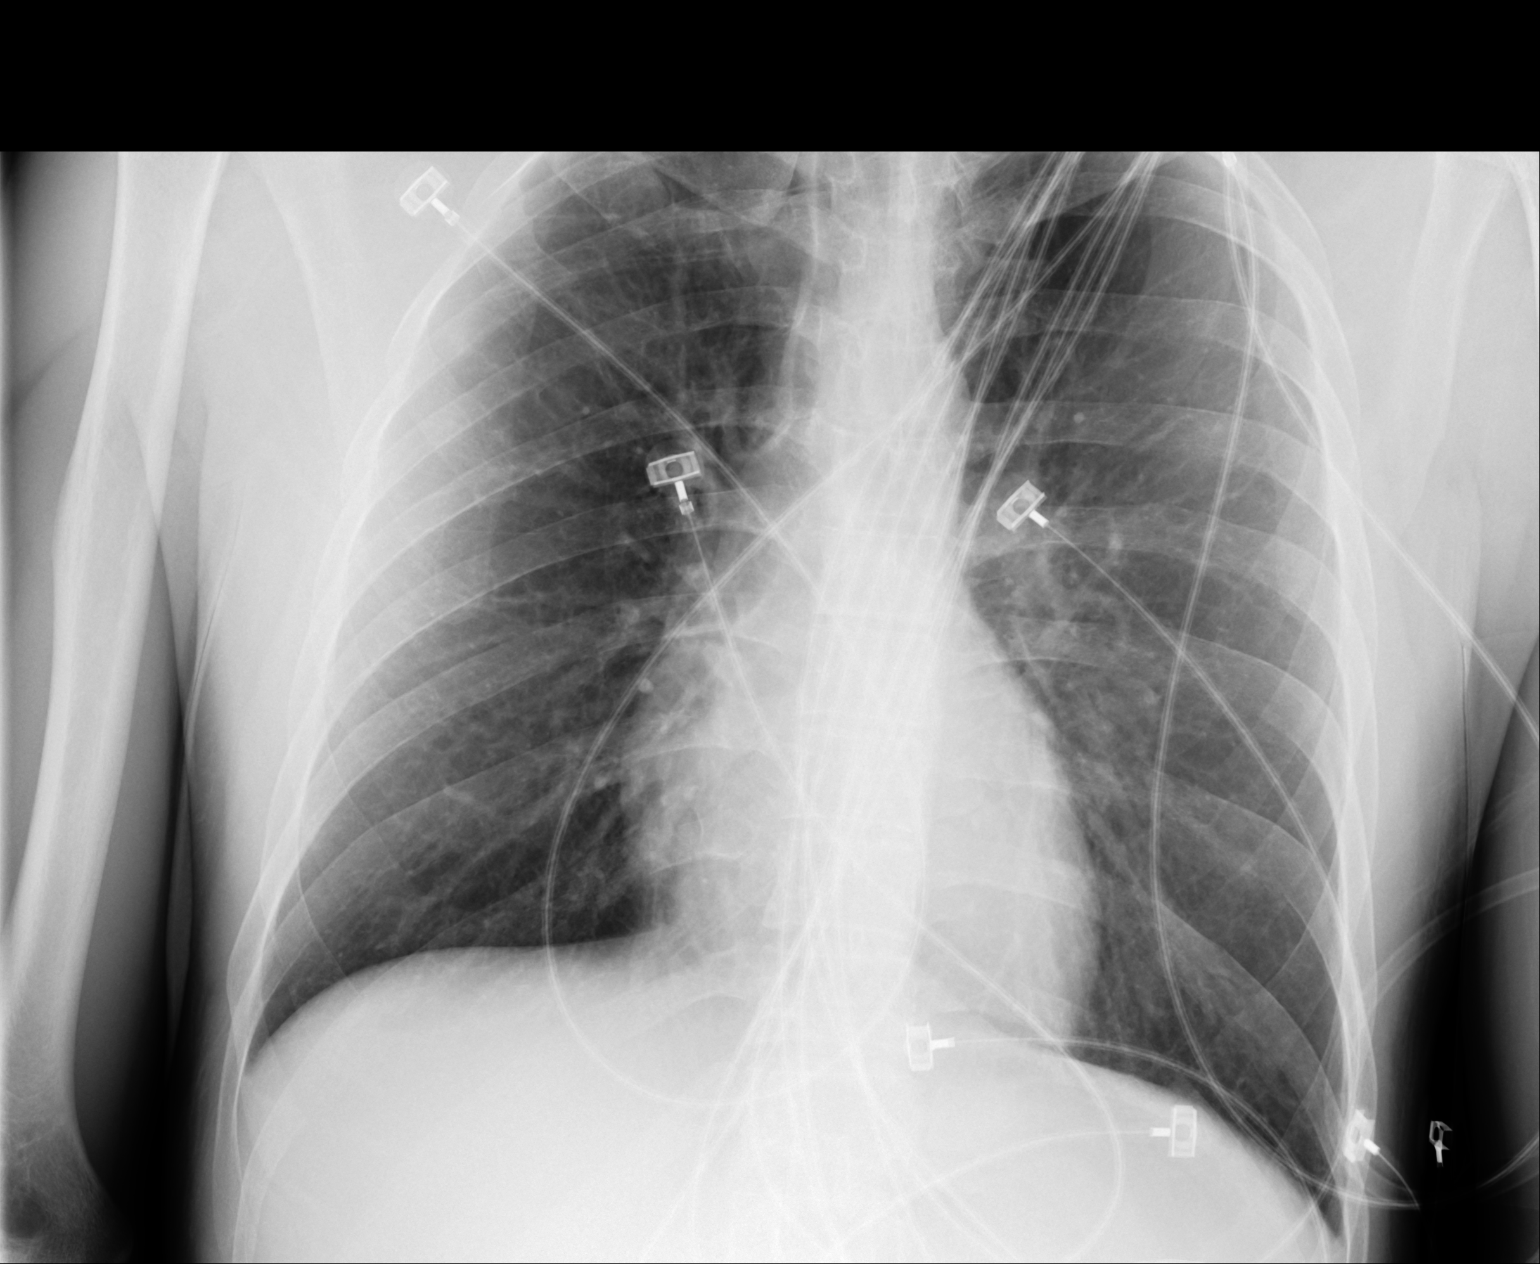

[1 of 1 positions shown; findings below may reference images not displayed]

FINDINGS: The heart size and mediastinal contours are within normal limits.
Both lungs are clear. No pneumothorax or pleural effusion is noted.
The visualized skeletal structures are unremarkable.
IMPRESSION: No acute cardiopulmonary abnormality seen.

## 2017-08-07 IMAGING — CR DG ABD PORTABLE 1V
1 series · 1 of 1 positions shown · non-contrast
Comparison: None.

CLINICAL DATA: Abdominal pain.

EXAM:
PORTABLE ABDOMEN - 1 VIEW

[supine ap]
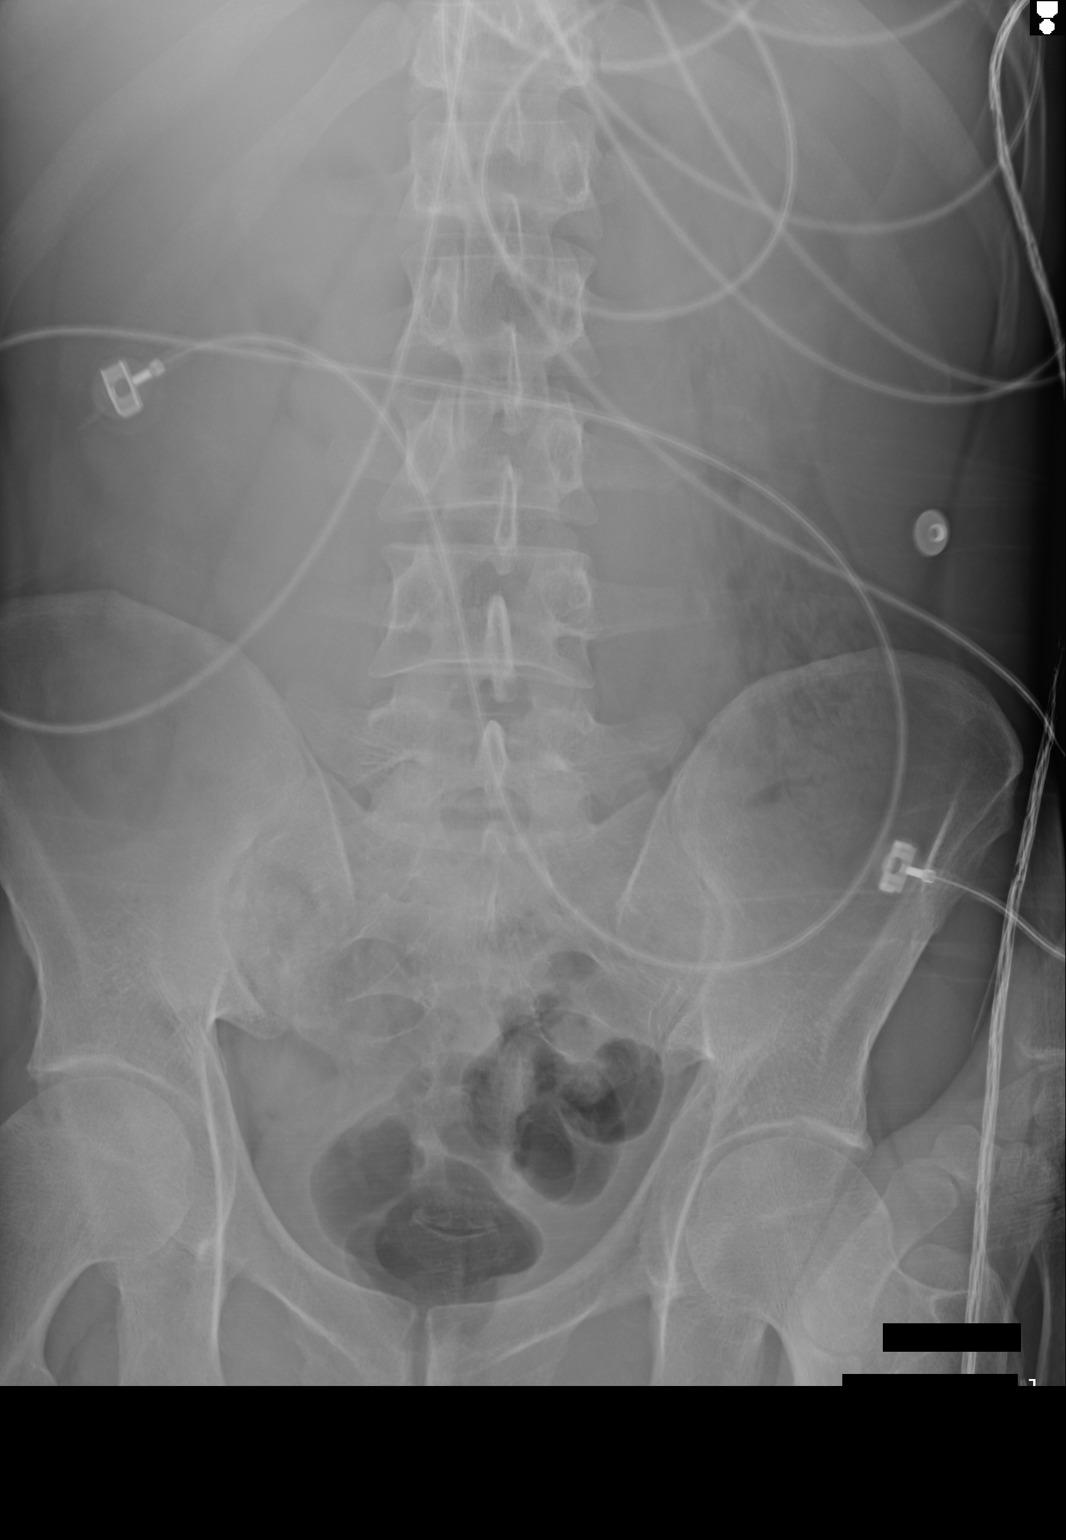

[1 of 1 positions shown; findings below may reference images not displayed]

FINDINGS: The bowel gas pattern is normal. No radio-opaque calculi or other
significant radiographic abnormality are seen.
IMPRESSION: No evidence of bowel obstruction or ileus.

## 2023-01-14 ENCOUNTER — Ambulatory Visit: Payer: MEDICAID | Admitting: Family Medicine
